# Patient Record
Sex: Female | Born: 1970
Health system: Southern US, Community
[De-identification: ages and names within clinical notes are randomized; demographics above are authoritative.]

## PROBLEM LIST (undated history)

## (undated) DIAGNOSIS — IMO0002 Reserved for concepts with insufficient information to code with codable children: Secondary | ICD-10-CM

## (undated) DIAGNOSIS — I7381 Erythromelalgia: Secondary | ICD-10-CM

## (undated) DIAGNOSIS — J302 Other seasonal allergic rhinitis: Secondary | ICD-10-CM

## (undated) DIAGNOSIS — Z9889 Other specified postprocedural states: Secondary | ICD-10-CM

## (undated) HISTORY — DX: Other seasonal allergic rhinitis: J30.2

## (undated) HISTORY — DX: Erythromelalgia: I73.81

## (undated) HISTORY — DX: Reserved for concepts with insufficient information to code with codable children: IMO0002

## (undated) HISTORY — PX: OTHER SURGICAL HISTORY: SHX169

## (undated) HISTORY — DX: Other specified postprocedural states: Z98.890

## (undated) HISTORY — PX: WISDOM TOOTH EXTRACTION: SHX21

---
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
## 2003-03-29 ENCOUNTER — Other Ambulatory Visit: Admission: RE | Admit: 2003-03-29 | Discharge: 2003-03-29 | Payer: Self-pay | Admitting: Obstetrics and Gynecology

## 2003-04-26 ENCOUNTER — Encounter: Admission: RE | Admit: 2003-04-26 | Discharge: 2003-04-26 | Payer: Self-pay | Admitting: Family Medicine

## 2004-05-21 ENCOUNTER — Other Ambulatory Visit: Admission: RE | Admit: 2004-05-21 | Discharge: 2004-05-21 | Payer: Self-pay | Admitting: Obstetrics and Gynecology

## 2004-11-28 ENCOUNTER — Inpatient Hospital Stay (HOSPITAL_COMMUNITY): Admission: RE | Admit: 2004-11-28 | Discharge: 2004-12-01 | Payer: Self-pay | Admitting: Obstetrics and Gynecology

## 2005-01-09 ENCOUNTER — Other Ambulatory Visit: Admission: RE | Admit: 2005-01-09 | Discharge: 2005-01-09 | Payer: Self-pay | Admitting: Obstetrics and Gynecology

## 2006-01-12 ENCOUNTER — Other Ambulatory Visit: Admission: RE | Admit: 2006-01-12 | Discharge: 2006-01-12 | Payer: Self-pay | Admitting: Obstetrics and Gynecology

## 2008-04-24 LAB — CONVERTED CEMR LAB: Pap Smear: NORMAL

## 2008-11-05 ENCOUNTER — Emergency Department (HOSPITAL_BASED_OUTPATIENT_CLINIC_OR_DEPARTMENT_OTHER): Admission: EM | Admit: 2008-11-05 | Discharge: 2008-11-05 | Payer: Self-pay | Admitting: Emergency Medicine

## 2008-11-13 ENCOUNTER — Ambulatory Visit: Payer: Self-pay | Admitting: Internal Medicine

## 2008-11-13 DIAGNOSIS — I7381 Erythromelalgia: Secondary | ICD-10-CM | POA: Insufficient documentation

## 2009-03-09 ENCOUNTER — Ambulatory Visit: Payer: Self-pay | Admitting: Internal Medicine

## 2009-03-30 ENCOUNTER — Telehealth: Payer: Self-pay | Admitting: Internal Medicine

## 2009-04-03 DIAGNOSIS — I7381 Erythromelalgia: Secondary | ICD-10-CM

## 2009-04-03 HISTORY — DX: Erythromelalgia: I73.81

## 2009-04-06 ENCOUNTER — Encounter: Payer: Self-pay | Admitting: Internal Medicine

## 2009-04-18 ENCOUNTER — Telehealth: Payer: Self-pay | Admitting: Internal Medicine

## 2009-04-20 ENCOUNTER — Ambulatory Visit: Payer: Self-pay | Admitting: Internal Medicine

## 2009-10-08 ENCOUNTER — Ambulatory Visit: Payer: Self-pay | Admitting: Internal Medicine

## 2009-12-26 ENCOUNTER — Telehealth: Payer: Self-pay | Admitting: Internal Medicine

## 2010-03-24 ENCOUNTER — Encounter: Payer: Self-pay | Admitting: Obstetrics and Gynecology

## 2010-04-02 NOTE — Consult Note (Signed)
Summary: [facility] Rheumatology & Clinical Immunology  [facility] Rheumatology & Clinical Immunology   Imported By: Lanelle Bal 04/26/2009 10:26:10  _____________________________________________________________________  External Attachment:    Type:   Image     Comment:   External Document

## 2010-04-02 NOTE — Assessment & Plan Note (Signed)
Summary: 1 month follow up/mhf   Vital Signs:  Patient profile:   40 year old female Weight:      108 pounds BMI:     20.48 O2 Sat:      100 % on Room air Temp:     98.1 degrees F oral Pulse rate:   64 / minute Pulse rhythm:   regular Resp:     16 per minute BP sitting:   110 / 60  (right arm) Cuff size:   regular  Vitals Entered By: Glendell Docker CMA (April 20, 2009 9:58 AM)  O2 Flow:  Room air  Primary Care Provider:  [name] DO  CC:  1 Month Follow up.  History of Present Illness: 1 Month Follow up   40 y/o white female for f/u she was seen by Dr Christen Bame (rheum) at [facility] he feels her symptoms more consistent with erythromelalgia. she tried aspirin but with no significant improvement   Allergies: 1)  ! Pcn  Past History:  Past Medical History: Chronic red painful toes - question raynaud's vs erythomelagia     Past Surgical History: Caesarean section 2003 & 2006     Family History: Family History of Alcoholism/Addiction Father Family History of Arthritis Maternal grandmother Family History Breast cancer maternal great aunt Family History High cholesterol maternal grandparents Family History High cholesterol- maternal grandparents Family History Hypertension- Mother/maternal grandmother Family History Kidney disease maternal grandfather Family History Depression-maternal grandmother      Social History: Occupation: Scientific laboratory technician (homemaker) Married- 13 years 1 son 7   1 daughter 4    Physical Exam  General:  alert, well-developed, and well-nourished.   Lungs:  normal respiratory effort and normal breath sounds.   Heart:  normal rate, regular rhythm, and no gallop.   Skin:  tips of toes - left foot 2-4 red and swollen   Impression & Recommendations:  Problem # 1:  ERYTHROMELALGIA (ZOX-096.04) Pt seen by Dr. Christen Bame at [facility].  He feels her symptoms more consisent with erthromelalgia.  No improvement with aspirin therapy.   We discussed  stopping BCP to see if it has any effect.  Complete Medication List: 1)  Enpresse-28 Tabs (Levonorg-eth estrad triphasic) .... Take 1 tablet by mouth once a day 2)  All Day Calcium 600-500 Mg-unit Xr24h-tab (Calcium carb-cholecalciferol) .... Take 1 tablet by mouth once a day

## 2010-04-02 NOTE — Assessment & Plan Note (Signed)
Summary: COUGH/HEA   Vital Signs:  Patient profile:   40 year old female Height:      61 inches Weight:      105.25 pounds BMI:     19.96 O2 Sat:      98 % on Room air Temp:     98.6 degrees F oral Pulse rate:   69 / minute Pulse rhythm:   regular Resp:     18 per minute BP sitting:   102 / 68  (right arm) Cuff size:   regular  Vitals Entered By: Glendell Docker CMA (October 08, 2009 1:10 PM)  O2 Flow:  Room air CC: Rm 3- Cough Is Patient Diabetic? No Pain Assessment Patient in pain? no       Does patient need assistance? Functional Status Self care Ambulation Normal   Primary Care Provider:  DThomos Lemons DO  CC:  Rm 3- Cough.  History of Present Illness: 40 y/o white female c/o productive cough , interferring with sleep, chest discomfort from cough, denies fever, taken delsym with little relief  c/o fatigue some nasal congestion  daughter was sick 1 week ago with febrile illness but is feeling better  Preventive Screening-Counseling & Management  Alcohol-Tobacco     Smoking Status: never  Allergies: 1)  ! Pcn  Past History:  Past Medical History: Chronic red painful toes - erythomelagia    (Evaluated by rheum - Dr Christen Bame at [facility] 04/2009)  Past Surgical History: Caesarean section 2003 & 2006      Family History: Family History of Alcoholism/Addiction Father Family History of Arthritis Maternal grandmother Family History Breast cancer maternal great aunt Family History High cholesterol maternal grandparents Family History High cholesterol- maternal grandparents Family History Hypertension- Mother/maternal grandmother Family History Kidney disease maternal grandfather Family History Depression-maternal grandmother       Physical Exam  General:  alert, well-developed, and well-nourished.   Ears:  R ear normal and L ear normal.   Mouth:  pharyngeal erythema.   Neck:  supple, no masses, and no neck tenderness.   Lungs:  normal respiratory  effort and normal breath sounds.   Heart:  normal rate, regular rhythm, and no gallop.     Impression & Recommendations:  Problem # 1:  BRONCHITIS-ACUTE (ICD-466.0)  Her updated medication list for this problem includes:    Azithromycin 250 Mg Tabs (Azithromycin) .Marland Kitchen... 2 tabs on day one, then one by mouth once daily x 4 days    Hydrocodone-homatropine 5-1.5 Mg/1ml Syrp (Hydrocodone-homatropine) .Marland KitchenMarland KitchenMarland KitchenMarland Kitchen 5 ml by mouth at bedtime as needed for cough  Take antibiotics and other medications as directed. Encouraged to push clear liquids, get enough rest, and take acetaminophen as needed. To be seen in 5-7 days if no improvement, sooner if worse.  Complete Medication List: 1)  Enpresse-28 Tabs (Levonorg-eth estrad triphasic) .... Take 1 tablet by mouth once a day 2)  All Day Calcium 600-500 Mg-unit Xr24h-tab (Calcium carb-cholecalciferol) .... Take 1 tablet by mouth once a day 3)  Azithromycin 250 Mg Tabs (Azithromycin) .... 2 tabs on day one, then one by mouth once daily x 4 days 4)  Hydrocodone-homatropine 5-1.5 Mg/48ml Syrp (Hydrocodone-homatropine) .... 5 ml by mouth at bedtime as needed for cough  Patient Instructions: 1)  Call our office if your symptoms do not  improve or gets worse. Prescriptions: HYDROCODONE-HOMATROPINE 5-1.5 MG/5ML SYRP (HYDROCODONE-HOMATROPINE) 5 ml by mouth at bedtime as needed for cough  #60 ml x 0   Entered and Authorized by:  Dondra Spry DO   Signed by:   [name] DO on 10/08/2009   Method used:   Print then Give to Patient   RxID:   [id number] AZITHROMYCIN 250 MG TABS (AZITHROMYCIN) 2 tabs on day one, then one by mouth once daily x 4 days  #6 x 0   Entered and Authorized by:   [name] DO   Signed by:   [name] DO on 10/08/2009   Method used:   Electronically to        [facility] (retail)       [address]       [place], [place]  [address]       Ph: [phone]       Fax: [phone]   RxID:    [id number]   Current Allergies (reviewed today): ! PCN

## 2010-04-02 NOTE — Assessment & Plan Note (Signed)
Summary: Feet red and swollen/hea   Vital Signs:  Patient profile:   40 year old female Height:      61 inches Weight:      107.50 pounds BMI:     20.39 O2 Sat:      99 % on Room air Temp:     98.4 degrees F oral Pulse rate:   54 / minute Pulse rhythm:   regular Resp:     16 per minute BP sitting:   100 / 70  (left arm) Cuff size:   regular  Vitals Entered By: Glendell Docker CMA (March 09, 2009 11:01 AM)  O2 Flow:  Room air  Primary Care Provider:  [name] DO  CC:  Bilateral Feet pain.  History of Present Illness: 40 y/o white female with possible hx of Raynauds c/o red toes  - L > R x one week.  her symptoms always worse in Winter.  she has never had hand symptoms. she was prev seen by dermatologist who tried topical cream w/o success.    Allergies: 1)  ! 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   Lungs:  normal respiratory effort and normal breath sounds.   Heart:  normal rate, regular rhythm, and no gallop.   Pulses:  radial,dorsalis pedis and posterior tibial pulses are full and equal bilaterally Skin:  tips of toes - left foot 2-4 red and swollen   Impression & Recommendations:  Problem # 1:  RAYNAUD'S SYNDROME (ICD-443.0)  Pt usually has flare during winter  months.  toes are usually affected.  she saw rheumaotoligst 7-8 years ago.  she reports w/u for secondary raynauds negative.  her current symptoms limited to left toes.    refer to rheumatologist at [facility] for repeat work up.  consider anti phospholipid syndrome  trial of low dose nifedipine.  pt will ask pharm if she can take split nifedipine tab. take EC aspirin 81 mg once daily  Orders: Rheumatology Referral (Rheumatology)  Complete Medication List: 1)  Enpresse-28 Tabs (Levonorg-eth estrad triphasic) .... Take 1 tablet by mouth once a day 2)  Multivitamins Tabs (Multiple vitamin) .... Take 1 tablet by mouth once a day 3)  All Day Calcium 600-500 Mg-unit Xr24h-tab (Calcium carb-cholecalciferol) .... Take 1 tablet by mouth once a day 4)  Nifedipine 30 Mg Xr24h-tab (Nifedipine) .Marland Kitchen.. 1 by mouth once daily  Other Orders: Influenza Vaccine NON MCR ([address]109) Admin 1st Vaccine (60454)  Patient Instructions: 1)  Please schedule a follow-up appointment in 1 month. Prescriptions: NIFEDIPINE 30 MG XR24H-TAB (NIFEDIPINE) 1 by mouth once daily  #30 x 1   Entered and Authorized by:   [name] DO   Signed by:   [name] DO on 03/09/2009   Method used:   Electronically to        [facility] (retail)       [address]       [place], [place]  [address]       Ph: [phone]       Fax: [phone]   RxID:   [id number]    Immunizations Administered:  Influenza Vaccine # 1:    Vaccine Type: Fluvax Non-MCR    Site: left deltoid    Mfr: GlaxoSmithKline    Dose: 0.5 ml    Route: IM    Given by: Glendell Docker CMA    Exp. Date: 08/30/2009    Lot #: [id number]    VIS given: 10/10/2008  Flu Vaccine Consent Questions:    Do you have a history of severe allergic reactions to this vaccine? no    Any prior history of allergic reactions to egg and/or gelatin? no    Do you have a sensitivity to the preservative Thimersol? no    Do you have a past history of  Guillan-Barre Syndrome? no    Do you currently have an acute febrile illness? no    Have you ever had a severe reaction to latex? no    Vaccine information given and explained to patient? yes    Are you currently pregnant? no   Current Allergies (reviewed today): ! PCN

## 2010-04-02 NOTE — Assessment & Plan Note (Signed)
Summary: new to [place] per dr Kyrel Leighton/mhf remove staples   Vital Signs:  Patient profile:   40 year old female Height:      611 inches Weight:      106.50 pounds BMI:     0.20 Temp:     99.1 degrees F oral Pulse rate:   60 / minute Pulse rhythm:   regular Resp:     18 per minute BP sitting:   110 / 80  (left arm) Cuff size:   regular  Vitals Entered By: Glendell Docker CMA (November 13, 2008 4:10 PM)  Primary Care Provider:  Dondra Spry DO  CC:  New patient.  History of Present Illness: New Patient  40 y/o white female to establish.  She suffered scalp laceration on 11/05/2008.  She accidentally hit her head with minivan door.  There was profuse bleeding.  She was seen at ER and wound was closed with 3 staples.  She notes mild headahes which are getting better.  No redness or drainage from scalp.  She kept area clean and applied abx ointment daily.  She has otherwise be healthy. Two C sections.     After first child, she noticed redness and pain or bilateral toes when exposed to cold weather.  She was seen by rheumatologist - w/u was negative.  Possible primary Raynaud's vs dermatologic etiology.  Derm recommended trial of rogaine to toes.  Preventive Screening-Counseling & Management  Alcohol-Tobacco     Alcohol drinks/day: 1     Alcohol type: wine     Alcohol Counseling: to decrease amount and/or frequency of alcohol intake     Smoking Status: never     Tobacco Counseling: not indicated; no tobacco use  Caffeine-Diet-Exercise     Caffeine use/day: 1-2 cups coffe daily     Caffeine Counseling: not indicated; caffeine use is not excessive or problematic     Does Patient Exercise: yes     Times/week: 4  Allergies (verified): 1)  ! 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  The patient denies fever, chest pain, dyspnea on exertion, prolonged cough, abdominal pain, melena, hematochezia, and severe indigestion/heartburn.         All other systems were reviewed and were negative. No hx of oral ulcers.  No arthritis.  No rashes.    Physical Exam  General:  alert, well-developed, and well-nourished.   Head:  healed 3-4 cm laceration 1 inch above hair line. Eyes:  vision grossly intact, pupils equal, pupils round, and pupils reactive to light.   Ears:  R ear normal and L ear normal.   Mouth:  Oral mucosa and oropharynx without lesions or exudates.  Teeth in good repair. Neck:  No deformities, masses, or tenderness noted. Lungs:  normal respiratory effort, normal breath sounds, and no wheezes.   Heart:  normal  rate, regular rhythm, no murmur, and no gallop.   Abdomen:  soft, non-tender, normal bowel sounds, no hepatomegaly, and no splenomegaly.   Pulses:  dorsalis pedis and posterior tibial pulses are full and equal bilaterally Extremities:  No lower extremity edema  Neurologic:  cranial nerves II-XII intact and gait normal.   Psych:  normally interactive, good eye contact, not anxious appearing, and not depressed appearing.     Impression & Recommendations:  Problem # 1:  ENCOUNTER FOR REMOVAL OF SUTURES (ICD-V58.32) Area prepped with betadyne.  3 staples removed from scalp.  No  complication.  No wound dehiecense.  Discussed aftercare.  Patient advised to call office if symptoms persist or worsen.  Problem # 2:  RAYNAUD'S SYNDROME (ICD-443.0) Possible hx of raynaud's of bilateral toes.  Obtain records from rheumatologist and dermatologist.  Complete Medication List: 1)  Enpresse-28 Tabs (Levonorg-eth estrad triphasic) .... Take 1 tablet by mouth once a day 2)  Multivitamins Tabs (Multiple vitamin) .... Take 1 tablet by mouth once a day  Current Allergies (reviewed today): ! PCN   Preventive Care Screening  Last Tetanus Booster:    Date:  11/05/2008    Results:  Tdap   Pap Smear:    Date:  04/24/2008    Results:  normal

## 2010-04-02 NOTE — Progress Notes (Signed)
Summary: Alllergy Headaches  Phone Note Call from Patient Call back at [phone]   Caller: Patient Call For: [name] DO Summary of Call: patient called and left voice message stating she is having problems with seasonal allergies. She has had an increase in headches and pressure above her eye. She would like to know if there is something that she could take over the counter to get relief. Initial call taken by: Glendell Docker CMA,  December 26, 2009 9:23 AM  Follow-up for Phone Call        she can try using allergra 180 mg once daily use neil med sinus rinse  2-3 x per week also use ibuprofen 400 mg two times a day as needed (up to 1 week)  Follow-up by: [name] DO,  December 26, 2009 11:51 AM  Additional Follow-up for Phone Call Additional follow up Details #1::        call returned to patient at [phone], she was advised per Dr Artist Pais instructions Additional Follow-up by: Glendell Docker CMA,  December 27, 2009 8:02 AM

## 2010-04-02 NOTE — Letter (Signed)
Summary: [facility] Rheumatology & Clinical Immunology  [facility] Rheumatology & Clinical Immunology   Imported By: Lanelle Bal 05/10/2009 08:13:48  _____________________________________________________________________  External Attachment:    Type:   Image     Comment:   External Document

## 2010-04-02 NOTE — Progress Notes (Signed)
Summary: Medication Adjustment  Phone Note Call from Patient   Caller: Patient Details for Reason: Medication   Summary of Call: Pt called  given rx  for Raynard( Nifedipine),medication has helped feet , but  causing  Headaches and fatique , would like to stop medication. She has appt next Friday to see Rheumatologist at [facility] . should she cont. medication until after this appt  please advise   phone # [phone] Initial call taken by: Darral Dash,  March 30, 2009 3:23 PM  Follow-up for Phone Call        I suggest she try taking lower dose.  see rx Follow-up by: [name] DO,  March 30, 2009 5:03 PM  Additional Follow-up for Phone Call Additional follow up Details #1::        patient advised per Dr Artist Pais instructions Additional Follow-up by: Glendell Docker CMA,  March 30, 2009 5:19 PM    New/Updated Medications: NIFEDIPINE 10 MG CAPS (NIFEDIPINE) one by mouth two times a day Prescriptions: NIFEDIPINE 10 MG CAPS (NIFEDIPINE) one by mouth two times a day  #60 x 0   Entered and Authorized by:   [name] DO   Signed by:   [name] DO on 03/30/2009   Method used:   Electronically to        [facility] (retail)       [address]       [place], [place]  [address]       Ph: [phone]       Fax: [phone]   RxID:   [id number]

## 2010-04-02 NOTE — Progress Notes (Signed)
Summary: want to make sure you recieved Dr.Valen's notes  Phone Note Call from Patient   Summary of Call: She wanted to make sure Dr.Chanele Douglas got  the ov notes from Dr.Balen (Specialist) that Dr.Nasiah Polinsky had sent her to.   She states that Dr.Mansel Strother needs her ov notes from the specialist by the time of her next f/u appt. with Dr.Chris Cripps Initial call taken by: Michaelle Copas,  April 18, 2009 12:11 PM  Follow-up for Phone Call        Office notes requested via fax from  [facility] hospital Follow-up by: Glendell Docker CMA,  April 18, 2009 4:20 PM

## 2010-05-06 ENCOUNTER — Other Ambulatory Visit: Payer: Self-pay | Admitting: Obstetrics and Gynecology

## 2010-05-06 DIAGNOSIS — Z1231 Encounter for screening mammogram for malignant neoplasm of breast: Secondary | ICD-10-CM

## 2010-05-17 ENCOUNTER — Other Ambulatory Visit (HOSPITAL_BASED_OUTPATIENT_CLINIC_OR_DEPARTMENT_OTHER): Payer: Self-pay | Admitting: Obstetrics and Gynecology

## 2010-05-17 DIAGNOSIS — Z1239 Encounter for other screening for malignant neoplasm of breast: Secondary | ICD-10-CM

## 2010-07-05 ENCOUNTER — Ambulatory Visit (HOSPITAL_BASED_OUTPATIENT_CLINIC_OR_DEPARTMENT_OTHER)
Admission: RE | Admit: 2010-07-05 | Discharge: 2010-07-05 | Disposition: A | Discharge status: Home / Self Care | Payer: BC Managed Care – PPO | Source: Ambulatory Visit | Attending: Obstetrics and Gynecology | Admitting: Obstetrics and Gynecology

## 2010-07-05 DIAGNOSIS — Z1239 Encounter for other screening for malignant neoplasm of breast: Secondary | ICD-10-CM

## 2010-07-05 DIAGNOSIS — Z1231 Encounter for screening mammogram for malignant neoplasm of breast: Secondary | ICD-10-CM

## 2010-07-09 ENCOUNTER — Ambulatory Visit: Payer: Self-pay

## 2010-07-19 NOTE — H&P (Signed)
Melinda Andersen, Melinda Andersen                 ACCOUNT NO.:  [id number]   MEDICAL RECORD NO.:  [id number]          PATIENT TYPE:  INP   LOCATION:  NA                            [facility]ILITY[facility]  [facility]   PHYSICIAN:  Janine Limbo, M.D.DATE OF BIRTH:  [birth year]   DATE OF ADMISSION:  DATE OF DISCHARGE:                                HISTORY & PHYSICAL   DATE OF SURGERY:  November 28, 2004.   HISTORY OF PRESENT ILLNESS:  Melinda Andersen is a 40 year old female, gravida 2,  para 1-0-0-1, who presents at 40 weeks' gestation (EDC is December 12, 2004).  The patient has been followed at the [facility] and  [facility].  The patient has had a  prior cesarean section and she desires a repeat cesarean section.  Her  pregnancy has been largely uncomplicated.   OBSTETRICAL HISTORY:  In August 2003, the patient delivered a 6 pound 8  ounce female infant by cesarean delivery.   DRUG ALLERGIES:  PENICILLIN causes a rash.   PAST MEDICAL HISTORY:  1.  The patient denies hypertension and diabetes.  2.  She had her wisdom teeth removed at the age of 73.   SOCIAL HISTORY:  The patient denies cigarette use, alcohol use, and  recreational drug use.   REVIEW OF SYSTEMS:  Normal pregnancy complaints.   FAMILY HISTORY:  Noncontributory.   PHYSICAL EXAMINATION:  VITAL SIGNS:  Weight is 132 pounds, height is 5 feet  1 inch.  HEENT:  Within normal limits.  CHEST:  Clear.  HEART:  Regular rate and rhythm.  BREASTS:  Without masses.  ABDOMEN:  Gravid with a fundal height of 33-to-34-cm.  EXTREMITIES:  Within normal limits.  NEUROLOGIC:  Grossly normal.  PELVIC:  Cervix was closed when last checked.   LABORATORY VALUES:  Blood type is A positive.  Antibody screen negative.  VDRL nonreactive.  Rubella immune.  HVSAG negative.  Pap is within normal  limits.  Third trimester beta strep is negative.  Glucola screen within  normal limits.   ASSESSMENT:  1.  Term  intrauterine pregnancy.  2.  Prior cesarean section.  3.  Desires repeat cesarean section.   PLAN:  The patient will undergo a repeat low-transverse cesarean section.  She understands the indications for her surgical procedure, and she accepts  the risk of, but not limited to, anesthetic complications, bleeding,  infections, and possible damage to the surrounding organs.      Janine Limbo, M.D.  Electronically Signed     AVS/MEDQ  D:  11/23/2004  T:  11/23/2004  Job:  [id number]

## 2010-07-19 NOTE — Discharge Summary (Signed)
Melinda Andersen, Melinda Andersen                 ACCOUNT NO.:  [id number]   MEDICAL RECORD NO.:  [id number]          PATIENT TYPE:  INP   LOCATION:  9138                          [facility]ILITY:  [facility]   PHYSICIAN:  Janine Limbo, M.D.DATE OF BIRTH:  1970-12-19   DATE OF ADMISSION:  11/28/2004  DATE OF DISCHARGE:  12/01/2004                                 DISCHARGE SUMMARY   ADMISSION DIAGNOSES:  1.  Intrauterine pregnancy at term.  2.  Previous cesarean section.  3.  Desires repeat cesarean section.   DISCHARGE DIAGNOSES:  1.  Intrauterine pregnancy at term.  2.  Previous cesarean section.  3.  Desires repeat cesarean section.  4.  Status post cesarean section delivery of a viable female infant named      Melinda Andersen weighing 5 pounds 10 ounces, Apgars 9 and 9.   OPERATION/PROCEDURE:  1.  Spinal anesthesia.  2.  Repeat low transverse cesarean section.   HOSPITAL COURSE:  The patient was admitted for an elective repeat cesarean  section performed under spinal anesthesia by Dr. Stefano Gaul with no  complications.  Estimated blood loss of 800 mL.  She was delivered of a  female infant weighing 5 pounds 10 ounces, Apgars 9 and 9.  Taken to  recovery and then to mother/baby unit where she did well breast feeding.  On  day #1, hemoglobin was 11.2.  Temperature was 100.2, but remained afebrile  after that.  She was given routine postoperative care.  On postoperative day  #2, she continued to improve.  Was tolerating foods and voiding without  problems.  On postoperative day #3, she continued to improve and was ready  to go home.  Vital signs were stable.  Chest was clear.  Heart was regular  rate and rhythm.  Abdomen was soft and appropriately tender.  Incision was  clean, dry and intact with subcutaneous sutures.  Lochia was small.  Extremities within normal limits.  She was deemed to have received the full  benefit of her hospitalization and was discharged home.   DISCHARGE MEDICATIONS:  1.  Motrin  600 mg p.o. q.6h. p.r.n.  2.  Tylox one to two p.o. q.4h. p.r.n.   DISCHARGE LABORATORY DATA:  White blood cell count 10.5, hemoglobin 11.2,  platelet count 193, RPR nonreactive.   DISCHARGE INSTRUCTIONS:  Per [facility] handout.   FOLLOW UP:  In six weeks or p.r.n.      Marie L. Williams, C.N.M.      Janine Limbo, M.D.  Electronically Signed    MLW/MEDQ  D:  12/01/2004  T:  12/01/2004  Job:  [id number]

## 2010-07-19 NOTE — Op Note (Signed)
Melinda Andersen, Melinda Andersen                 ACCOUNT NO.:  [id number]   MEDICAL RECORD NO.:  [id number]          PATIENT TYPE:  INP   LOCATION:  9199                          FACILITY:  [facility]   PHYSICIAN:  Janine Limbo, M.D.DATE OF BIRTH:  1970-12-22   DATE OF PROCEDURE:  11/28/2004  DATE OF DISCHARGE:                                 OPERATIVE REPORT   PREOPERATIVE DIAGNOSIS:  1.  Term intrauterine gestation.  2.  Prior cesarean section.  3.  Desires repeat cesarean section.   POSTOPERATIVE DIAGNOSIS:  1.  Term intrauterine gestation.  2.  Prior cesarean section.  3.  Desires repeat cesarean section.   PROCEDURE:  Repeat low transverse cesarean section.   SURGEON:  Dr. Leonard Schwartz   FIRST ASSISTANT:  Elby Showers. Williams, C.N.M.   ANESTHETIC:  Spinal.   DISPOSITION:  Melinda Andersen is a 40 year old female, gravida 2, para 1-0-0-1,  who presents at [redacted] weeks gestation for a repeat cesarean section. She has  been followed at the [facility] division of [facility]. The patient understands the indications for her surgical  procedure and she accepts the risk of, but not limited to, anesthetic  complications, bleeding, infection, and possible damage to the surrounding  organs.   FINDINGS:  A 5 pounds 10 ounces female infant Orvan Falconer) was delivered from  cephalic presentation. The Apgars were 9 at one minute and 9 at 5 minutes.  The uterus, fallopian tubes, and the ovaries were normal for the gravid  state.   PROCEDURE:  The patient was taken to the operating room where a spinal  anesthetic was given. The patient's abdomen and perineum were prepped with  multiple layers of Betadine. A Foley catheter was placed in the bladder. The  patient was then sterilely draped. The lower abdomen was injected with 10 mL  of half percent Marcaine with epinephrine. A low transverse incision was  made and carried sharply through the subcutaneous tissue, fascia,  and the  anterior peritoneum. An incision was made in the lower uterine segment and  extended transversely. The fetal head was delivered without difficulty. The  mouth and nose were suctioned. Remainder of the infant was delivered. The  cord was clamped and cut and the infant was handed to the awaiting pediatric  team. Routine cord blood studies were obtained. The placenta was removed.  The uterine cavity was cleaned of amniotic fluid, clotted blood, and  membranes. The uterine incision was closed using a running locking suture of  2-0 Vicryl followed by imbricating suture of 2-0 Vicryl. Hemostasis was  adequate. The patient was noted to drain clear yellow urine. The pelvis was  then vigorously irrigated. Again hemostasis was adequate. The anterior  peritoneum and abdominal musculature were reapproximated in the midline  using 2-0 Vicryl. The fascia was closed using a running suture of 0 Vicryl  followed by three interrupted sutures of 0 Vicryl. The subcutaneous layer  was closed using 0 Vicryl. Skin was reapproximated using a 3-0 Monocryl. The  prior scar was removed prior to closure. Sponge, needle, instrument  counts  were correct on two occasions. Estimated blood loss for the procedure was  800 cc. The patient tolerated her procedure well. The patient was taken to  the recovery room in stable condition. The infant was taken to the full-term  nursery in stable condition.      Janine Limbo, M.D.  Electronically Signed     AVS/MEDQ  D:  11/28/2004  T:  11/28/2004  Job:  [id number]

## 2011-03-13 ENCOUNTER — Ambulatory Visit: Payer: BC Managed Care – PPO

## 2011-03-14 ENCOUNTER — Ambulatory Visit (INDEPENDENT_AMBULATORY_CARE_PROVIDER_SITE_OTHER): Payer: BC Managed Care – PPO | Admitting: Internal Medicine

## 2011-03-14 DIAGNOSIS — Z23 Encounter for immunization: Secondary | ICD-10-CM

## 2011-03-15 NOTE — Progress Notes (Signed)
Patient ID: Melinda Andersen, female   DOB: 1970-05-30, 41 y.o.   MRN: [id number]

## 2011-04-25 ENCOUNTER — Ambulatory Visit (INDEPENDENT_AMBULATORY_CARE_PROVIDER_SITE_OTHER): Payer: BC Managed Care – PPO | Admitting: Family

## 2011-04-25 ENCOUNTER — Encounter: Payer: Self-pay | Admitting: Family

## 2011-04-25 VITALS — BP 110/72 | HR 68 | Temp 97.8°F | Resp 16 | Wt 110.0 lb

## 2011-04-25 DIAGNOSIS — J329 Chronic sinusitis, unspecified: Secondary | ICD-10-CM

## 2011-04-25 MED ORDER — LEVOFLOXACIN 500 MG PO TABS: 500.0000 mg | ORAL_TABLET | Freq: Every day | ORAL | Status: AC

## 2011-04-25 NOTE — Assessment & Plan Note (Signed)
Will plan to treat with Levaquin x 7 days.  Recommended motrin PRN HA and sudafed as needed during the day for congestion.  Recommended [place] of condoms as back up birth control due to possible drug interaction with abx.

## 2011-04-25 NOTE — Patient Instructions (Signed)

## 2011-04-25 NOTE — Progress Notes (Signed)
  Subjective:    Patient ID: Melinda Andersen, female    DOB: Jun 04, 1970, 41 y.o.   MRN: [id number]  HPI  Melinda Andersen is a 41 yr old female who presents today with chief complaint of frontal headache.  Notes that it is painful to touch above her eyes.   Notes chronic nasal drainage, now more yellow in color.  HA is associated with poor energy.  Denies associated ear pain, cough or known fever. .  Denies previous hx of sinus infection.  She has tried tylenol at home and ibuprofen without relief.     Review of Systems See HPI  Past Medical History  Diagnosis Date  . Erythromelalgia 04/2009    chronic red painful toes--Dr Christen Bame at [facility] (rheumatologist)    History   Social History  . Marital Status: Married    Spouse Name: N/A    Number of Children: 2  . Years of Education: N/A   Occupational History  . Not on file.   Social History Main Topics  . Smoking status: Never Smoker   . Smokeless tobacco: Never Used  . Alcohol Use: Not on file  . Drug Use: Not on file  . Sexually Active: Not on file   Other Topics Concern  . Not on file   Social History Narrative  . No narrative on file    Past Surgical History  Procedure Date  . Cesarean section 2003 & 2006    Family History  Problem Relation Age of Onset  . Alcohol abuse Father   . Arthritis Maternal Grandmother   . Hyperlipidemia Maternal Grandmother   . Hypertension Maternal Grandmother   . Depression Maternal Grandmother   . Hyperlipidemia Maternal Grandfather   . Kidney disease Maternal Grandfather   . Cancer Other     breast    Allergies  Allergen Reactions  . Penicillins     REACTION: Rash    No current outpatient prescriptions on file prior to visit.    BP 110/72  Pulse 68  Temp(Src) 97.8 F (36.6 C) (Oral)  Resp 16  Wt 110 lb (49.896 kg)  SpO2 95%       Objective:   Physical Exam  Constitutional: She appears well-developed and well-nourished. No distress.  HENT:  Head: Normocephalic and  atraumatic.  Right Ear: Tympanic membrane and ear canal normal.  Left Ear: Tympanic membrane and ear canal normal.  Mouth/Throat: No oropharyngeal exudate.  Eyes: Pupils are equal, round, and reactive to light.  Neck: Neck supple.  Cardiovascular: Normal rate and regular rhythm.   No murmur heard. Pulmonary/Chest: Effort normal and breath sounds normal. No respiratory distress. She has no wheezes. She has no rales. She exhibits no tenderness.  Musculoskeletal: She exhibits no edema.  Lymphadenopathy:    She has no cervical adenopathy.  Skin: Skin is warm and dry.  Psychiatric: She has a normal mood and affect. Her behavior is normal. Judgment and thought content normal.          Assessment & Plan:

## 2011-05-07 ENCOUNTER — Ambulatory Visit (INDEPENDENT_AMBULATORY_CARE_PROVIDER_SITE_OTHER): Payer: BC Managed Care – PPO | Admitting: Obstetrics and Gynecology

## 2011-05-07 DIAGNOSIS — R87619 Unspecified abnormal cytological findings in specimens from cervix uteri: Secondary | ICD-10-CM

## 2011-05-07 DIAGNOSIS — Z01419 Encounter for gynecological examination (general) (routine) without abnormal findings: Secondary | ICD-10-CM

## 2011-05-07 DIAGNOSIS — IMO0002 Reserved for concepts with insufficient information to code with codable children: Secondary | ICD-10-CM

## 2011-05-07 HISTORY — DX: Unspecified abnormal cytological findings in specimens from cervix uteri: R87.619

## 2011-05-07 HISTORY — DX: Reserved for concepts with insufficient information to code with codable children: IMO0002

## 2011-06-23 ENCOUNTER — Other Ambulatory Visit: Payer: Self-pay | Admitting: Obstetrics and Gynecology

## 2011-06-23 DIAGNOSIS — Z1231 Encounter for screening mammogram for malignant neoplasm of breast: Secondary | ICD-10-CM

## 2011-07-09 ENCOUNTER — Ambulatory Visit (HOSPITAL_BASED_OUTPATIENT_CLINIC_OR_DEPARTMENT_OTHER)
Admission: RE | Admit: 2011-07-09 | Discharge: 2011-07-09 | Disposition: A | Discharge status: Home / Self Care | Payer: BC Managed Care – PPO | Source: Ambulatory Visit | Attending: Obstetrics and Gynecology | Admitting: Obstetrics and Gynecology

## 2011-07-09 DIAGNOSIS — Z1231 Encounter for screening mammogram for malignant neoplasm of breast: Secondary | ICD-10-CM | POA: Insufficient documentation

## 2011-08-05 ENCOUNTER — Encounter: Payer: Self-pay | Admitting: Obstetrics and Gynecology

## 2011-11-25 ENCOUNTER — Ambulatory Visit (INDEPENDENT_AMBULATORY_CARE_PROVIDER_SITE_OTHER): Payer: BC Managed Care – PPO | Admitting: Obstetrics and Gynecology

## 2011-11-25 ENCOUNTER — Encounter: Payer: Self-pay | Admitting: Obstetrics and Gynecology

## 2011-11-25 VITALS — BP 102/62 | Wt 111.0 lb

## 2011-11-25 DIAGNOSIS — IMO0001 Reserved for inherently not codable concepts without codable children: Secondary | ICD-10-CM

## 2011-11-25 DIAGNOSIS — IMO0002 Reserved for concepts with insufficient information to code with codable children: Secondary | ICD-10-CM

## 2011-11-25 DIAGNOSIS — R922 Inconclusive mammogram: Secondary | ICD-10-CM

## 2011-11-25 DIAGNOSIS — R87610 Atypical squamous cells of undetermined significance on cytologic smear of cervix (ASC-US): Secondary | ICD-10-CM

## 2011-11-25 DIAGNOSIS — B977 Papillomavirus as the cause of diseases classified elsewhere: Secondary | ICD-10-CM

## 2011-11-25 NOTE — Progress Notes (Signed)
HISTORY OF PRESENT ILLNESS  [name] is a 41 y.o. year old female,G2P2002, who presents for a problem visit. The patient has a history of ASCUS Pap smears.  She also was found to have dense breast on her mammogram.  She has a known history of stress urinary incontinence.  Subjective:  The patient is doing well.  Objective:  BP 102/62  Wt 111 lb (50.349 kg)  LMP 10/31/2011   General: no distress GI: soft and nontender  External genitalia: normal general appearance Vaginal: normal without tenderness, induration or masses and relaxation noted Cervix: normal appearance Adnexa: normal bimanual exam Uterus: normal size shape and consistency  Assessment:  Abnormal Pap smears, ascus Dense breast Stress urinary incontinence  Plan:  Pap smear sent.  The patient and I discussed the implications of having dense breast on mammogram.  Only five states in the [place] require that patients be notified of dense breast on mammogram.  [place] recently passed a Social worker requiring notification. Only one state requires that insurance companies pay for follow-up evaluation.  The patient was told that dense breast are an independent risk factor for breast cancer.  She was told that having dense breast does not mean that the patient has breast cancer, nor will develop breast cancer.  It is known that dense breast make it more difficult to read and interpret mammograms.  There are other modalities for evaluating breast.  They include breast ultrasound, tomosynthesis, and MRI.  The patient understands that these tests are expensive, and that her insurance company may or may not pay to have these done. Ultrasound, tomosynthesis, and MRI show more findings that are not cancer, which can result in additional testing and unnecessary biopsies. The patient was offered the previously mentioned tests, but declines further testing at this time. The patient was told that the [employer] of  Obstetricians and Gynecologists recommends annual mammograms starting at age 80.  Breast awareness was reviewed with the patient.  Her questions were answered.  The patient will return for her annual exam.   Return to office in 6 month(s) for her annual exam.   Leonard Schwartz M.D.  11/25/2011 7:49 PM    History of previous cervical treatment:  no treatment  Last pap showed:  atypical squamous cellularity of undetermined significance (ASCUS)  Date 05/07/2011  High Risk HPV present: no  Gardasil received: no   Tobacco use:  No Using Folic Acid: no

## 2011-11-26 LAB — PAP IG W/ RFLX HPV ASCU

## 2011-11-28 LAB — HUMAN PAPILLOMAVIRUS, HIGH RISK: HPV DNA High Risk: NOT DETECTED

## 2011-12-01 NOTE — Progress Notes (Signed)
Quick Note:  Send atypical Pap smear letter with a note to return for repeat Pap in six months. ______ 

## 2011-12-02 ENCOUNTER — Encounter: Payer: Self-pay | Admitting: Obstetrics and Gynecology

## 2011-12-16 ENCOUNTER — Ambulatory Visit (INDEPENDENT_AMBULATORY_CARE_PROVIDER_SITE_OTHER): Payer: BC Managed Care – PPO | Admitting: Family

## 2011-12-16 ENCOUNTER — Encounter: Payer: Self-pay | Admitting: Family

## 2011-12-16 VITALS — BP 120/80 | HR 55 | Temp 99.0°F | Resp 16

## 2011-12-16 DIAGNOSIS — H6692 Otitis media, unspecified, left ear: Secondary | ICD-10-CM | POA: Insufficient documentation

## 2011-12-16 DIAGNOSIS — H6690 Otitis media, unspecified, unspecified ear: Secondary | ICD-10-CM

## 2011-12-16 DIAGNOSIS — Z23 Encounter for immunization: Secondary | ICD-10-CM

## 2011-12-16 DIAGNOSIS — J309 Allergic rhinitis, unspecified: Secondary | ICD-10-CM | POA: Insufficient documentation

## 2011-12-16 MED ORDER — AZITHROMYCIN 250 MG PO TABS: ORAL_TABLET | ORAL | Status: DC

## 2011-12-16 MED ORDER — FLUTICASONE PROPIONATE 50 MCG/ACT NA SUSP: 2.0000 | Freq: Every day | NASAL | Status: DC

## 2011-12-16 NOTE — Assessment & Plan Note (Signed)
Trial of flonase. Continue allegra.

## 2011-12-16 NOTE — Patient Instructions (Addendum)

## 2011-12-16 NOTE — Progress Notes (Signed)
  Subjective:    Patient ID: Melinda Andersen, female    DOB: 05-01-1970, 41 y.o.   MRN: [id number]  HPI  Ms. Lalone is a 41 yr old female who presents today with chief complaint of left ear pain.  Pain started about 1 week ago and is reported as "fulllness."  She reports associated post nasal drip for months.  Post nasal drip is not improved by claritin or allegra. She denies associated fever.     Review of Systems See HPI  Past Medical History  Diagnosis Date  . Erythromelalgia 04/2009    chronic red painful toes--Dr Christen Bame at [facility] (rheumatologist)  . Abnormal Pap smear 05/07/2011    ascus     History   Social History  . Marital Status: Married    Spouse Name: N/A    Number of Children: 2  . Years of Education: N/A   Occupational History  . Not on file.   Social History Main Topics  . Smoking status: Never Smoker   . Smokeless tobacco: Never Used  . Alcohol Use: Not on file  . Drug Use: No  . Sexually Active: Yes    Birth Control/ Protection: Pill   Other Topics Concern  . Not on file   Social History Narrative  . No narrative on file    Past Surgical History  Procedure Date  . Cesarean section 2003 & 2006  . Wisdom tooth extraction   . Oral surgery     Family History  Problem Relation Age of Onset  . Alcohol abuse Father   . Arthritis Maternal Grandmother   . Hyperlipidemia Maternal Grandmother   . Hypertension Maternal Grandmother   . Depression Maternal Grandmother   . Hyperlipidemia Maternal Grandfather   . Kidney disease Maternal Grandfather   . Cancer Other     breast    Allergies  Allergen Reactions  . Penicillins     REACTION: Rash    Current Outpatient Prescriptions on File Prior to Visit  Medication Sig Dispense Refill  . levonorgestrel-ethinyl estradiol (ENPRESSE,TRIVORA) tablet Take 1 tablet by mouth daily.      Marland Kitchen loratadine (CLARITIN) 10 MG tablet Take 10 mg by mouth daily as needed.      . fexofenadine (ALLEGRA) 180 MG tablet Take  180 mg by mouth daily.      . fluticasone (FLONASE) 50 MCG/ACT nasal spray Place 2 sprays into the nose daily.  16 g  3    BP 120/80  Pulse 55  Temp 99 F (37.2 C) (Oral)  Resp 16  SpO2 98%  LMP 11/28/2011       Objective:   Physical Exam  Constitutional: She is oriented to person, place, and time. She appears well-developed and well-nourished. No distress.  HENT:  Head: Normocephalic and atraumatic.  Right Ear: Tympanic membrane and ear canal normal.       Streaking left ear  Cardiovascular: Normal rate and regular rhythm.   No murmur heard. Pulmonary/Chest: Effort normal and breath sounds normal. No respiratory distress. She has no wheezes. She has no rales. She exhibits no tenderness.  Lymphadenopathy:    She has no cervical adenopathy.  Neurological: She is alert and oriented to person, place, and time.  Skin: Skin is warm and dry. No rash noted. No erythema. No pallor.  Psychiatric: She has a normal mood and affect. Her behavior is normal. Judgment and thought content normal.          Assessment & Plan:

## 2011-12-16 NOTE — Assessment & Plan Note (Signed)
Mild L OM.  Rx with zithromax due to penicillin allergy.  She is reminded to use back up protection due to possible OCP interaction.

## 2011-12-22 ENCOUNTER — Telehealth: Payer: Self-pay | Admitting: Internal Medicine

## 2011-12-22 MED ORDER — CEFUROXIME AXETIL 500 MG PO TABS: 500.0000 mg | ORAL_TABLET | Freq: Two times a day (BID) | ORAL | Status: DC

## 2011-12-22 NOTE — Telephone Encounter (Signed)
Caller: [name]; Patient Name: Melinda Andersen; PCP: Peggyann Juba, Melissa (Adults only); [facility] Phone Number: [phone].  Pt reports being seen in the office on 12/16/11 for ear infection.  Pt denies any new symptoms.  Pt has been taking the antibiotic and flonase but ear is still painful (stuffiness).  Pt is afebrile. Triaged patient per Ear Pain Protocol.  See Provider within 24 hours Disposition for 'Constant or intermittent dull earache, throbbing in ear or feeling of fullness; may interfere with sleep or ability to carry out normal activity'.  OFFICE, please follow up with pt if new medication needs to be prescribed or if pt needs to come back in for her symptoms.

## 2011-12-22 NOTE — Telephone Encounter (Signed)
Attempted to reach pt. No answer.  Trish could you please call pt in AM and let her know that I have sent rx for ceftin to her pharmacy? She should start if her symptoms are not better tomorrow.  Use back up birth control.  Let [place] know if no improvement in ear discomfort within 2-3 days after starting.

## 2011-12-23 NOTE — Telephone Encounter (Signed)
Notified pt of instructions below. She reports that symptoms are feeling improved today. She will hold off on antibiotic unless symptoms worsen or do not continue to improve.

## 2012-05-25 ENCOUNTER — Other Ambulatory Visit: Payer: Self-pay | Admitting: Obstetrics and Gynecology

## 2012-05-27 LAB — PAP IG W/ RFLX HPV ASCU

## 2012-06-09 ENCOUNTER — Other Ambulatory Visit: Payer: Self-pay | Admitting: Obstetrics and Gynecology

## 2012-06-09 ENCOUNTER — Other Ambulatory Visit: Payer: Self-pay

## 2012-06-09 DIAGNOSIS — Z1231 Encounter for screening mammogram for malignant neoplasm of breast: Secondary | ICD-10-CM

## 2012-06-11 ENCOUNTER — Telehealth: Payer: Self-pay | Admitting: *Deleted

## 2012-06-11 NOTE — Telephone Encounter (Signed)
I recommend claritin once daily. Call if symptoms worsen or if not  Improved in 1 week. Continue flonase.

## 2012-06-11 NOTE — Telephone Encounter (Signed)
Left detailed message on cell and to call if any questions. 

## 2012-06-11 NOTE — Telephone Encounter (Signed)
Pt left message on voicemail that her allergies were flared up. States she has started using flonase x 3-4 days and it doesn't seem to be helping much. Wants to know what else she can do in addition to that. Left message on cell# to call with symptoms that she is having and any other medications she may be taking for her symptoms.

## 2012-06-11 NOTE — Telephone Encounter (Signed)
Pt left message on voicemail that her allergies were flared up. States she has started using flonase x 3-4 days and it doesn't seem to be helping much. Wants to know what else she can do in addition to that. Left message on cell# to call with symptoms that she is having and any other medications she may be taking for her symptoms. 

## 2012-06-11 NOTE — Telephone Encounter (Signed)
Pt called back stating she has not tried anything else but flonase for her: sneezing, cough, congestion and headace. Reports that she did take ibuprofen for her headache but wants to know what else we may recommend for these symptoms? Please advise.

## 2012-07-13 ENCOUNTER — Ambulatory Visit
Admission: RE | Admit: 2012-07-13 | Discharge: 2012-07-13 | Disposition: A | Discharge status: Home / Self Care | Payer: BC Managed Care – PPO | Source: Ambulatory Visit

## 2012-07-13 DIAGNOSIS — Z1231 Encounter for screening mammogram for malignant neoplasm of breast: Secondary | ICD-10-CM

## 2012-09-09 ENCOUNTER — Ambulatory Visit (INDEPENDENT_AMBULATORY_CARE_PROVIDER_SITE_OTHER): Payer: BC Managed Care – PPO | Admitting: Nurse Practitioner

## 2012-09-09 ENCOUNTER — Encounter: Payer: Self-pay | Admitting: Nurse Practitioner

## 2012-09-09 ENCOUNTER — Telehealth: Payer: Self-pay | Admitting: *Deleted

## 2012-09-09 VITALS — BP 120/84 | HR 59 | Temp 97.9°F | Resp 16 | Ht 60.98 in | Wt 109.1 lb

## 2012-09-09 DIAGNOSIS — Z9109 Other allergy status, other than to drugs and biological substances: Secondary | ICD-10-CM

## 2012-09-09 DIAGNOSIS — H1045 Other chronic allergic conjunctivitis: Secondary | ICD-10-CM

## 2012-09-09 DIAGNOSIS — H1013 Acute atopic conjunctivitis, bilateral: Secondary | ICD-10-CM

## 2012-09-09 MED ORDER — OLOPATADINE HCL 0.1 % OP SOLN: 1.0000 [drp] | Freq: Two times a day (BID) | OPHTHALMIC | Status: DC

## 2012-09-09 MED ORDER — AZELASTINE-FLUTICASONE 137-50 MCG/ACT NA SUSP: 1.0000 | Freq: Two times a day (BID) | NASAL | Status: DC

## 2012-09-09 NOTE — Telephone Encounter (Signed)
Pt decided she would like rxs to go to [facility] instead of [facility]. Cancelled rxs (olopatadine and azelastine-fluticasone) at [facility] with Pam and called them to pharmacist at [facility].

## 2012-09-09 NOTE — Patient Instructions (Signed)
Start nasal antihistamine-it has flonase in it. Use 1 spray each nostril twice daily. Use eye drops as directed. It is important that you begin washing sinuses daily (Neilmed Sinus Rinse)-after packing & unpacking boxes. Feel better!

## 2012-09-09 NOTE — Progress Notes (Signed)
Subjective:     Melinda Andersen is a 42 y.o. female who presents for evaluation and treatment of allergic symptoms. Symptoms include: headaches, itchy eyes, sinus pressure, sneezing and swelling of eyes and are not present in a seasonal pattern. Precipitants include: packing boxes. Treatment currently includes nasal saline and is not effective. She has a prescription for flonase, but is not using it. She has taken allegra and claritin in the past, but has not used either med in at least 1 week. The following portions of the patient's history were reviewed and updated as appropriate: allergies, current medications, past medical history, past surgical history and problem list.  Review of Systems Constitutional: negative for chills, fatigue, fevers and night sweats Eyes: positive for irritation and redness Ears, nose, mouth, throat, and face: positive for sinus pressure, negative for earaches, hoarseness, nasal congestion and sore throat Respiratory: negative for asthma, cough and wheezing Cardiovascular: negative for chest pressure/discomfort, irregular heart beat, lower extremity edema and palpitations Gastrointestinal: negative for abdominal pain and diarrhea Allergic/Immunologic: positive for seasonal allergies    Objective:    BP 120/84  Pulse 59  Temp(Src) 97.9 F (36.6 C) (Oral)  Resp 16  Ht 5' 0.98" (1.549 m)  Wt 109 lb 1.9 oz (49.497 kg)  BMI 20.63 kg/m2  SpO2 99% General appearance: alert, cooperative, appears stated age and no distress Head: Normocephalic, without obvious abnormality, atraumatic Eyes: negative findings: lids and lashes normal, corneas clear and pupils equal, round, reactive to light and accomodation, positive findings: sclera injected, bilaterally Ears: normal TM's and external ear canals both ears and both TM retracted Nose: Nares normal. Septum midline. Mucosa normal. No drainage or sinus tenderness. Throat: lips, mucosa, and tongue normal; teeth and gums  normal Lungs: clear to auscultation bilaterally Heart: regular rate and rhythm, S1, S2 normal, no murmur, click, rub or gallop Lymph nodes: Cervical, supraclavicular, and axillary nodes normal.    Assessment:    Environmental Allergies.    Plan:    Medications: intranasal steroids: sinus rinses daily, intranasal antihistamines: ., .. Allergen avoidance discussed. Follow-up if no improvement.Marland Kitchen

## 2012-10-29 ENCOUNTER — Ambulatory Visit: Payer: Self-pay | Admitting: Family

## 2012-10-29 ENCOUNTER — Telehealth: Payer: Self-pay | Admitting: Internal Medicine

## 2012-10-29 NOTE — Telephone Encounter (Signed)
Patient Information:  Caller Name: [place]  Phone: [phone]  Patient: Melinda Andersen, Melinda Andersen  Gender: Female  DOB: 03/29/1970  Age: 42 Years  PCP: Charlynn Court (Pt reported Sandford Craze)  Pregnant: No  Office Follow Up:  Does the office need to follow up with this patient?: No  Instructions For The Office: N/A  RN Note:  Oral contraception.  Some large weeping blisters present.    Symptoms  Reason For Call & Symptoms: Suspected poison ivy on bilateral feet and lower legs. Worsening linear rash with itchy weeping blisters.  Reviewed Health History In EMR: Yes  Reviewed Medications In EMR: Yes  Reviewed Allergies In EMR: Yes  Reviewed Surgeries / Procedures: Yes  Date of Onset of Symptoms: 10/21/2012  Treatments Tried: Caladryl, Cortisone cream,  Benadryl hs  Treatments Tried Worked: No OB / GYN:  LMP: 10/01/2012  Guideline(s) Used:  Poison Ivy - Oak or [place]  Disposition Per Guideline:   See Today in Office  Reason For Disposition Reached:   Big blisters or oozing sores  Advice Given:  Hydrocortisone Cream for Itching:   Apply 1% hydrocortisone cream 4 times a day to reduce itching. Use it for 5 days.  Keep the cream in the refrigerator (Reason: it feels better if applied cold).  Apply Cold to the Area:  Soak the involved area in cool water for 20 minutes or massage it with an ice cube as often as necessary to reduce itching and oozing.  Oral Antihistamine Medication for Itching:   Antihistamines may cause sleepiness. Do not drink, drive, or operate dangerous machinery while taking antihistamines.  An over-the-counter antihistamine that causes less sleepiness is loratadine (e.g., Alavert or Claritin).  Avoid Scratching:   Cut your fingernails short and try not to scratch so as to prevent a secondary infection from bacteria.  New Blisters Appear:  If new blisters occur several days after the first ones, you probably have had ongoing contact with the irritating plant  oil. To prevent recurrences: bathe all dogs and wash all clothes and shoes that were with you on the day of exposure.  Contagiousness:  Poison ivy or oak is not contagious to others.  Expected Course:  Usually lasts 2 weeks. Treatment reduces the severity of the symptoms, not how long they last.  Call Back If:  Rash lasts longer than 3 weeks  It looks infected  You become worse.  Patient Will Follow Care Advice:  YES  Appointment Scheduled:  10/29/2012 13:15:00 Appointment Scheduled Provider:  Sandford Craze (Adults only)

## 2013-01-29 ENCOUNTER — Encounter: Payer: Self-pay | Admitting: Family Medicine

## 2013-01-29 ENCOUNTER — Ambulatory Visit (INDEPENDENT_AMBULATORY_CARE_PROVIDER_SITE_OTHER): Payer: BC Managed Care – PPO | Admitting: Family Medicine

## 2013-01-29 VITALS — BP 120/80 | Temp 98.4°F | Wt 110.0 lb

## 2013-01-29 DIAGNOSIS — R300 Dysuria: Secondary | ICD-10-CM

## 2013-01-29 DIAGNOSIS — N390 Urinary tract infection, site not specified: Secondary | ICD-10-CM | POA: Insufficient documentation

## 2013-01-29 LAB — POCT URINALYSIS DIPSTICK
Bilirubin, UA: NEGATIVE
Glucose, UA: NEGATIVE
Ketones, UA: NEGATIVE
Nitrite, UA: NEGATIVE
Protein, UA: NEGATIVE
Spec Grav, UA: 1.01
Urobilinogen, UA: NEGATIVE
pH, UA: 6

## 2013-01-29 MED ORDER — SULFAMETHOXAZOLE-TMP DS 800-160 MG PO TABS: 1.0000 | ORAL_TABLET | Freq: Two times a day (BID) | ORAL | Status: DC

## 2013-01-29 NOTE — Progress Notes (Signed)
Dysuria: yes, urgency, frequency, feeling of incomplete voiding, burning duration of symptoms:yesterday abdominal pain:no fevers:no back pain:no Vomiting:no U/a d/w pt.   No known h/o frequent UTIs.  Certainly none recently.    Meds, vitals, and allergies reviewed.   ROS: See HPI.  Otherwise negative.    GEN: nad, alert and oriented HEENT: mucous membranes moist NECK: supple CV: rrr.  PULM: ctab, no inc wob ABD: soft, +bs, suprapubic area mildly tender EXT: no edema SKIN: no acute rash BACK: no CVA pain

## 2013-01-29 NOTE — Assessment & Plan Note (Signed)
Nontoxic, ucx, septra, f/u prn. D/w pt.  She agrees.

## 2013-01-29 NOTE — Patient Instructions (Signed)
Drink plenty of water and start the antibiotics today.  We'll contact you with your lab report.  Take care.   Use back up birth control in the meantime.

## 2013-02-02 ENCOUNTER — Encounter: Payer: Self-pay | Admitting: Family Medicine

## 2013-05-12 ENCOUNTER — Telehealth: Payer: Self-pay | Admitting: Nurse Practitioner

## 2013-05-12 NOTE — Telephone Encounter (Signed)
Message copied by Carmelia BakeMERLIN, DIANE S on Thu May 12, 2013  2:54 PM ------      Message from: WEAVER, LAYNE COX      Created: Thu May 12, 2013 11:36 AM       She may schedule CPE any time.      ----- Message -----         From: Carmelia Bakeiane S Tomerlin         Sent: 05/05/2013   2:07 PM           To: Kelle DartingLayne C Weaver, NP            Patient wants to you to be her PCP. When do you recommend she schedule her next appointment?      Diane      ----- Message -----         From: Kelle DartingLayne C Weaver, NP         Sent: 05/05/2013   1:10 PM           To: Veryl Speakiane S Tomerlin            ? She is not my pt. I saw her for an acute last July. She saw Dr Para Marchuncan [place]. Need to gather more info.      ----- Message -----         From: Carmelia Bakeiane S Tomerlin         Sent: 05/05/2013  11:39 AM           To: Kelle DartingLayne C Weaver, NP            When should I schedule the patient to come in? Do I schedule it as a checkup or physical? Thanks, Diane       ----- Message -----         From: Sandford CrazeMelissa O'Sullivan, NP         Sent: 04/22/2013  10:02 AM           To: Carmelia Bakeiane S Tomerlin            Ok with me.       ----- Message -----         From: Carmelia Bakeiane S Tomerlin         Sent: 04/22/2013   8:10 AM           To: Kelle DartingLayne C Weaver, NP, Sandford CrazeMelissa O'Sullivan, NP            Patient would like to switch PCP. [place] [facility]                               ------

## 2013-05-12 NOTE — Telephone Encounter (Signed)
LM for pt to call & schedule CPE.

## 2013-06-07 ENCOUNTER — Other Ambulatory Visit: Payer: Self-pay

## 2013-06-07 DIAGNOSIS — Z1231 Encounter for screening mammogram for malignant neoplasm of breast: Secondary | ICD-10-CM

## 2013-07-15 ENCOUNTER — Ambulatory Visit
Admission: RE | Admit: 2013-07-15 | Discharge: 2013-07-15 | Disposition: A | Discharge status: Home / Self Care | Payer: BC Managed Care – PPO | Source: Ambulatory Visit

## 2013-07-15 DIAGNOSIS — Z1231 Encounter for screening mammogram for malignant neoplasm of breast: Secondary | ICD-10-CM

## 2013-12-14 ENCOUNTER — Ambulatory Visit (INDEPENDENT_AMBULATORY_CARE_PROVIDER_SITE_OTHER): Payer: BC Managed Care – PPO | Admitting: Nurse Practitioner

## 2013-12-14 ENCOUNTER — Encounter: Payer: Self-pay | Admitting: Nurse Practitioner

## 2013-12-14 VITALS — BP 104/60 | HR 63 | Temp 98.1°F | Resp 18 | Ht 60.0 in | Wt 110.0 lb

## 2013-12-14 DIAGNOSIS — Z9109 Other allergy status, other than to drugs and biological substances: Secondary | ICD-10-CM

## 2013-12-14 DIAGNOSIS — Z91048 Other nonmedicinal substance allergy status: Secondary | ICD-10-CM

## 2013-12-14 DIAGNOSIS — J069 Acute upper respiratory infection, unspecified: Secondary | ICD-10-CM

## 2013-12-14 DIAGNOSIS — Z23 Encounter for immunization: Secondary | ICD-10-CM

## 2013-12-14 MED ORDER — AZELASTINE-FLUTICASONE 137-50 MCG/ACT NA SUSP: 1.0000 | Freq: Two times a day (BID) | NASAL | Status: DC

## 2013-12-14 NOTE — Progress Notes (Signed)
Pre visit review using our clinic review tool, if applicable. No additional management support is needed unless otherwise documented below in the visit note. 

## 2013-12-14 NOTE — Patient Instructions (Addendum)
You have a virus that has exacerbated your underlying allergy symptoms. The average duration of cold symptoms is 14 days. Start daily sinus rinses (neilmed Sinus Rinse). Use 30 mg to 60 mg pseudoephedrine twice daily. Resume azelastine nasal spray. Apply vicks vapor rub under nose at night. Sip fluids every hour. Rest. If you are not feeling better in 1 week or develop fever or chest pain, call us for re-evaluation. Feel better!  Upper Respiratory Infection, Adult An upper respiratory infection (URI) is also sometimes known as the common cold. The upper respiratory tract includes the nose, sinuses, throat, trachea, and bronchi. Bronchi are the airways leading to the lungs. Most people improve within 1 week, but symptoms can last up to 2 weeks. A residual cough may last even longer.  CAUSES Many different viruses can infect the tissues lining the upper respiratory tract. The tissues become irritated and inflamed and often become very moist. Mucus production is also common. A cold is contagious. You can easily spread the virus to others by oral contact. This includes kissing, sharing a glass, coughing, or sneezing. Touching your mouth or nose and then touching a surface, which is then touched by another person, can also spread the virus. SYMPTOMS  Symptoms typically develop 1 to 3 days after you come in contact with a cold virus. Symptoms vary from person to person. They may include:  Runny nose.  Sneezing.  Nasal congestion.  Sinus irritation.  Sore throat.  Loss of voice (laryngitis).  Cough.  Fatigue.  Muscle aches.  Loss of appetite.  Headache.  Low-grade fever. DIAGNOSIS  You might diagnose your own cold based on familiar symptoms, since most people get a cold 2 to 3 times a year. Your caregiver can confirm this based on your exam. Most importantly, your caregiver can check that your symptoms are not due to another disease such as strep throat, sinusitis, pneumonia, asthma, or  epiglottitis. Blood tests, throat tests, and X-rays are not necessary to diagnose a common cold, but they may sometimes be helpful in excluding other more serious diseases. Your caregiver will decide if any further tests are required. RISKS AND COMPLICATIONS  You may be at risk for a more severe case of the common cold if you smoke cigarettes, have chronic heart disease (such as heart failure) or lung disease (such as asthma), or if you have a weakened immune system. The very young and very old are also at risk for more serious infections. Bacterial sinusitis, middle ear infections, and bacterial pneumonia can complicate the common cold. The common cold can worsen asthma and chronic obstructive pulmonary disease (COPD). Sometimes, these complications can require emergency medical care and may be life-threatening. PREVENTION  The best way to protect against getting a cold is to practice good hygiene. Avoid oral or hand contact with people with cold symptoms. Wash your hands often if contact occurs. There is no clear evidence that vitamin C, vitamin E, echinacea, or exercise reduces the chance of developing a cold. However, it is always recommended to get plenty of rest and practice good nutrition. TREATMENT  Treatment is directed at relieving symptoms. There is no cure. Antibiotics are not effective, because the infection is caused by a virus, not by bacteria. Treatment may include:  Increased fluid intake. Sports drinks offer valuable electrolytes, sugars, and fluids.  Breathing heated mist or steam (vaporizer or shower).  Eating chicken soup or other clear broths, and maintaining good nutrition.  Getting plenty of rest.  Using gargles or lozenges  for comfort.  Controlling fevers with ibuprofen or acetaminophen as directed by your caregiver.  Increasing usage of your inhaler if you have asthma. Zinc gel and zinc lozenges, taken in the first 24 hours of the common cold, can shorten the duration  and lessen the severity of symptoms. Pain medicines may help with fever, muscle aches, and throat pain. A variety of non-prescription medicines are available to treat congestion and runny nose. Your caregiver can make recommendations and may suggest nasal or lung inhalers for other symptoms.  HOME CARE INSTRUCTIONS   Only take over-the-counter or prescription medicines for pain, discomfort, or fever as directed by your caregiver.  Use a warm mist humidifier or inhale steam from a shower to increase air moisture. This may keep secretions moist and make it easier to breathe.  Drink enough water and fluids to keep your urine clear or pale yellow.  Rest as needed.  Return to work when your temperature has returned to normal or as your caregiver advises. You may need to stay home longer to avoid infecting others. You can also use a face mask and careful hand washing to prevent spread of the virus. SEEK MEDICAL CARE IF:   After the first few days, you feel you are getting worse rather than better.  You need your caregiver's advice about medicines to control symptoms.  You develop chills, worsening shortness of breath, or brown or red sputum. These may be signs of pneumonia.  You develop yellow or brown nasal discharge or pain in the face, especially when you bend forward. These may be signs of sinusitis.  You develop a fever, swollen neck glands, pain with swallowing, or white areas in the back of your throat. These may be signs of strep throat. SEEK IMMEDIATE MEDICAL CARE IF:   You have a fever.  You develop severe or persistent headache, ear pain, sinus pain, or chest pain.  You develop wheezing, a prolonged cough, cough up blood, or have a change in your usual mucus (if you have chronic lung disease).  You develop sore muscles or a stiff neck. Document Released: 08/13/2000 Document Revised: 05/12/2011 Document Reviewed: 06/21/2010 [facility] Patient Information 2014 [employer] [place].

## 2013-12-14 NOTE — Progress Notes (Signed)
   Subjective:    Patient ID: Melinda CourierKelly L Fitzwater, female    DOB: 06/26/1970, 43 y.o.   MRN: [id number]  HPI Comments: Pt has underlying fall & spring allergies, which she thinks have flared in last 2-3 weeks. In last 3 days, nasal congestion has been worse-stuffy at night, can't breathe through nose, & HA.  Sinusitis This is a new problem. The current episode started in the past 7 days. The problem has been gradually worsening since onset. There has been no fever. The pain is mild. Associated symptoms include congestion, headaches, sinus pressure and sneezing. Pertinent negatives include no chills, coughing, ear pain, hoarse voice, shortness of breath, sore throat or swollen glands. Treatments tried: mucinex OTC. The treatment provided mild relief.      Review of Systems  Constitutional: Negative for fever, chills, activity change, appetite change and fatigue.  HENT: Positive for congestion, sinus pressure and sneezing. Negative for ear pain, hoarse voice and sore throat.        Ears itching   Respiratory: Negative for cough, chest tightness, shortness of breath, wheezing and stridor.   Neurological: Positive for headaches.       Objective:   Physical Exam  Vitals reviewed. Constitutional: She is oriented to person, place, and time. She appears well-developed and well-nourished. No distress.  HENT:  Head: Normocephalic and atraumatic.  Right Ear: External ear normal.  Left Ear: External ear normal.  Mouth/Throat: Oropharynx is clear and moist. No oropharyngeal exudate.  TM retraction, bilaterally  Eyes: Conjunctivae are normal. Right eye exhibits no discharge. Left eye exhibits no discharge.  Neck: Normal range of motion. Neck supple. No thyromegaly present.  Cardiovascular: Normal rate, regular rhythm and normal heart sounds.   No murmur heard. Pulmonary/Chest: Effort normal and breath sounds normal. No respiratory distress. She has no wheezes. She has no rales.  Lymphadenopathy:   She has no cervical adenopathy.  Neurological: She is alert and oriented to person, place, and time.  Skin: Skin is warm and dry.  Psychiatric: She has a normal mood and affect. Her behavior is normal. Thought content normal.          Assessment & Plan:  1. Acute upper respiratory infection See pt instructions.  2. Environmental allergies - Azelastine-Fluticasone 137-50 MCG/ACT SUSP; Place 1 spray into the nose 2 (two) times daily.  Dispense: 1 Bottle; Refill: 6  F/u PRN

## 2013-12-22 ENCOUNTER — Ambulatory Visit (INDEPENDENT_AMBULATORY_CARE_PROVIDER_SITE_OTHER): Payer: BC Managed Care – PPO | Admitting: Nurse Practitioner

## 2013-12-22 ENCOUNTER — Encounter: Payer: Self-pay | Admitting: Nurse Practitioner

## 2013-12-22 ENCOUNTER — Telehealth: Payer: Self-pay | Admitting: Nurse Practitioner

## 2013-12-22 VITALS — BP 144/84 | HR 61 | Temp 98.4°F | Ht 60.0 in | Wt 109.0 lb

## 2013-12-22 DIAGNOSIS — J0111 Acute recurrent frontal sinusitis: Secondary | ICD-10-CM

## 2013-12-22 DIAGNOSIS — H9203 Otalgia, bilateral: Secondary | ICD-10-CM

## 2013-12-22 MED ORDER — DOXYCYCLINE HYCLATE 100 MG PO TABS: 100.0000 mg | ORAL_TABLET | Freq: Two times a day (BID) | ORAL | Status: DC

## 2013-12-22 MED ORDER — METHYLPREDNISOLONE ACETATE 40 MG/ML IJ SUSP
40.0000 mg | Freq: Once | INTRAMUSCULAR | Status: AC
  Administered 2013-12-22: 40 mg via INTRAMUSCULAR

## 2013-12-22 NOTE — Telephone Encounter (Signed)
Noted. Pt already seen for appt.

## 2013-12-22 NOTE — Progress Notes (Signed)
Pre visit review using our clinic review tool, if applicable. No additional management support is needed unless otherwise documented below in the visit note. 

## 2013-12-22 NOTE — Telephone Encounter (Signed)
Patient Information:  Caller Name: Tresa EndoKelly  Phone: [phone]  Patient: Melinda Andersen, Melinda Andersen  Gender: Female  DOB: 07/16/1970  Age: 43 Years  PCP: Sandford Craze'Sullivan, Melissa (Adults only)  Pregnant: No  Office Follow Up:  Does the office need to follow up with this patient?: No  Instructions For The Office: N/A  RN Note:  Appt scheduled for today(12/22/13) @ 1330 with Maximino SarinLayne Weaver NP  Symptoms  Reason For Call & Symptoms: Calling because ears are still clogged and still can't hear well--feels like she is in a drum, nasal congestion, loose cough. Was seen at the office on 12/14/13 and advised to use home treatments. Some sxs are getting better but hearing isn't.  Reviewed Health History In EMR: Yes  Reviewed Medications In EMR: Yes  Reviewed Allergies In EMR: Yes  Reviewed Surgeries / Procedures: Yes  Date of Onset of Symptoms: 12/12/2013  Treatments Tried: Neti pot, Flonase ,Sudafed  Treatments Tried Worked: No OB / GYN:  LMP: 12/22/2013  Guideline(s) Used:  Ear - Congestion  Hearing Loss  Disposition Per Guideline:   See Today in Office  Reason For Disposition Reached:   Patient wants to be seen  Advice Given:  Call Back If:   You become worse.  Patient Will Follow Care Advice:  YES  Appointment Scheduled:  12/22/2013 13:30:00 Appointment Scheduled Provider:  Maximino SarinWeaver, Layne

## 2013-12-22 NOTE — Patient Instructions (Signed)
  Start antibiotic. Eat yogurt daily to help prevent antibiotic -associated diarrhea.  Continue pseudoephedrine for 5 more days.  Continue azelastine daily.  Use vick's vapor rub under nose at night.  Use mechanical method several times daily-10 to help improve eustacian tube function.  Give this another week to 10 days.  Let us know if you develop fever.

## 2013-12-22 NOTE — Progress Notes (Signed)
   Subjective:    Patient ID: Melinda Andersen, female    DOB: 04/17/1970, 43 y.o.   MRN: [id number]  HPI Comments: Pt was seen 1 week ago w/3 days of nasal congestion & sore throat. Today she is tearful that she does not feel well & "doesn't have time to be sick".  Otalgia  There is pain in both (ears feel stopped up) ears. This is a new (about 10 days) problem. The current episode started 1 to 4 weeks ago (10 d). The problem occurs constantly. The problem has been gradually worsening. There has been no fever. The pain is mild. Associated symptoms include coughing and headaches. Pertinent negatives include no abdominal pain or vomiting. Associated symptoms comments: Nasal congestion. She has tried NSAIDs (pseudoephedrine, flonase, azelastine) for the symptoms. The treatment provided mild relief.      Review of Systems  Constitutional: Positive for fatigue. Negative for fever and chills.  HENT: Positive for congestion, ear pain and sinus pressure.   Respiratory: Positive for cough. Negative for choking, chest tightness, shortness of breath, wheezing and stridor.   Gastrointestinal: Negative for nausea, vomiting and abdominal pain.  Neurological: Positive for headaches.       Objective:   Physical Exam  Vitals reviewed. Constitutional: She is oriented to person, place, and time. She appears well-developed and well-nourished. No distress.  HENT:  Head: Normocephalic and atraumatic.  Right Ear: External ear normal.  Left Ear: External ear normal.  Mouth/Throat: Oropharynx is clear and moist. No oropharyngeal exudate.  R TM retraction. Bones visible, no fluid. Nml L TM  Eyes: Conjunctivae are normal. Right eye exhibits no discharge. Left eye exhibits no discharge.  Cardiovascular: Normal rate.   Pulmonary/Chest: Effort normal.  Neurological: She is alert and oriented to person, place, and time.  Skin: Skin is warm and dry.  Psychiatric: She has a normal mood and affect. Her behavior is  normal. Thought content normal.          Assessment & Plan:  1. Acute recurrent frontal sinusitis - doxycycline (VIBRA-TABS) 100 MG tablet; Take 1 tablet (100 mg total) by mouth 2 (two) times daily.  Dispense: 14 tablet; Refill: 0  2. Otalgia of both ears - methylPREDNISolone acetate (DEPO-MEDROL) injection 40 mg; Inject 1 mL (40 mg total) into the muscle once.  See pt instructions.

## 2014-01-02 ENCOUNTER — Encounter: Payer: Self-pay | Admitting: Nurse Practitioner

## 2014-06-13 ENCOUNTER — Other Ambulatory Visit: Payer: Self-pay

## 2014-06-13 DIAGNOSIS — Z1231 Encounter for screening mammogram for malignant neoplasm of breast: Secondary | ICD-10-CM

## 2014-06-23 ENCOUNTER — Encounter: Payer: Self-pay | Admitting: Nurse Practitioner

## 2014-06-23 DIAGNOSIS — Z0000 Encounter for general adult medical examination without abnormal findings: Secondary | ICD-10-CM | POA: Insufficient documentation

## 2014-07-21 ENCOUNTER — Ambulatory Visit
Admission: RE | Admit: 2014-07-21 | Discharge: 2014-07-21 | Disposition: A | Discharge status: Home / Self Care | Payer: BLUE CROSS/BLUE SHIELD | Source: Ambulatory Visit

## 2014-07-21 DIAGNOSIS — Z1231 Encounter for screening mammogram for malignant neoplasm of breast: Secondary | ICD-10-CM

## 2015-06-21 ENCOUNTER — Other Ambulatory Visit: Payer: Self-pay

## 2015-06-21 DIAGNOSIS — Z1231 Encounter for screening mammogram for malignant neoplasm of breast: Secondary | ICD-10-CM

## 2015-07-17 DIAGNOSIS — Z01411 Encounter for gynecological examination (general) (routine) with abnormal findings: Secondary | ICD-10-CM | POA: Diagnosis not present

## 2015-07-17 DIAGNOSIS — R1900 Intra-abdominal and pelvic swelling, mass and lump, unspecified site: Secondary | ICD-10-CM | POA: Diagnosis not present

## 2015-07-17 DIAGNOSIS — Z124 Encounter for screening for malignant neoplasm of cervix: Secondary | ICD-10-CM | POA: Diagnosis not present

## 2015-07-17 DIAGNOSIS — Z681 Body mass index (BMI) 19 or less, adult: Secondary | ICD-10-CM | POA: Diagnosis not present

## 2015-07-19 ENCOUNTER — Encounter: Payer: Self-pay | Admitting: Family Medicine

## 2015-07-26 ENCOUNTER — Ambulatory Visit
Admission: RE | Admit: 2015-07-26 | Discharge: 2015-07-26 | Disposition: A | Discharge status: Home / Self Care | Payer: BLUE CROSS/BLUE SHIELD | Source: Ambulatory Visit

## 2015-07-26 DIAGNOSIS — Z1231 Encounter for screening mammogram for malignant neoplasm of breast: Secondary | ICD-10-CM

## 2015-09-21 DIAGNOSIS — L0291 Cutaneous abscess, unspecified: Secondary | ICD-10-CM | POA: Diagnosis not present

## 2016-01-21 DIAGNOSIS — H1013 Acute atopic conjunctivitis, bilateral: Secondary | ICD-10-CM | POA: Diagnosis not present

## 2016-01-21 DIAGNOSIS — H5213 Myopia, bilateral: Secondary | ICD-10-CM | POA: Diagnosis not present

## 2016-01-21 DIAGNOSIS — H52223 Regular astigmatism, bilateral: Secondary | ICD-10-CM | POA: Diagnosis not present

## 2016-01-21 DIAGNOSIS — H524 Presbyopia: Secondary | ICD-10-CM | POA: Diagnosis not present

## 2016-03-31 DIAGNOSIS — Z23 Encounter for immunization: Secondary | ICD-10-CM | POA: Diagnosis not present

## 2016-07-21 DIAGNOSIS — Z1231 Encounter for screening mammogram for malignant neoplasm of breast: Secondary | ICD-10-CM | POA: Diagnosis not present

## 2016-07-21 DIAGNOSIS — Z309 Encounter for contraceptive management, unspecified: Secondary | ICD-10-CM | POA: Diagnosis not present

## 2016-07-21 DIAGNOSIS — Z124 Encounter for screening for malignant neoplasm of cervix: Secondary | ICD-10-CM | POA: Diagnosis not present

## 2016-07-21 DIAGNOSIS — R87612 Low grade squamous intraepithelial lesion on cytologic smear of cervix (LGSIL): Secondary | ICD-10-CM | POA: Diagnosis not present

## 2016-07-21 DIAGNOSIS — Z01411 Encounter for gynecological examination (general) (routine) with abnormal findings: Secondary | ICD-10-CM | POA: Diagnosis not present

## 2016-08-12 ENCOUNTER — Encounter: Payer: Self-pay | Admitting: Family Medicine

## 2016-08-12 DIAGNOSIS — R87612 Low grade squamous intraepithelial lesion on cytologic smear of cervix (LGSIL): Secondary | ICD-10-CM | POA: Diagnosis not present

## 2016-10-27 DIAGNOSIS — Z1389 Encounter for screening for other disorder: Secondary | ICD-10-CM | POA: Diagnosis not present

## 2016-10-27 DIAGNOSIS — Z0000 Encounter for general adult medical examination without abnormal findings: Secondary | ICD-10-CM | POA: Diagnosis not present

## 2017-02-16 DIAGNOSIS — J029 Acute pharyngitis, unspecified: Secondary | ICD-10-CM | POA: Diagnosis not present

## 2017-05-18 DIAGNOSIS — H524 Presbyopia: Secondary | ICD-10-CM | POA: Diagnosis not present

## 2017-05-18 DIAGNOSIS — H5213 Myopia, bilateral: Secondary | ICD-10-CM | POA: Diagnosis not present

## 2017-05-18 DIAGNOSIS — H52223 Regular astigmatism, bilateral: Secondary | ICD-10-CM | POA: Diagnosis not present

## 2017-08-06 DIAGNOSIS — Z1231 Encounter for screening mammogram for malignant neoplasm of breast: Secondary | ICD-10-CM | POA: Diagnosis not present

## 2017-08-06 DIAGNOSIS — Z6820 Body mass index (BMI) 20.0-20.9, adult: Secondary | ICD-10-CM | POA: Diagnosis not present

## 2017-08-06 DIAGNOSIS — R87610 Atypical squamous cells of undetermined significance on cytologic smear of cervix (ASC-US): Secondary | ICD-10-CM | POA: Diagnosis not present

## 2017-08-06 DIAGNOSIS — Z8742 Personal history of other diseases of the female genital tract: Secondary | ICD-10-CM | POA: Diagnosis not present

## 2017-08-06 DIAGNOSIS — Z01411 Encounter for gynecological examination (general) (routine) with abnormal findings: Secondary | ICD-10-CM | POA: Diagnosis not present

## 2017-08-06 DIAGNOSIS — Z3040 Encounter for surveillance of contraceptives, unspecified: Secondary | ICD-10-CM | POA: Diagnosis not present

## 2018-02-09 DIAGNOSIS — L710 Perioral dermatitis: Secondary | ICD-10-CM | POA: Diagnosis not present

## 2018-02-09 DIAGNOSIS — L578 Other skin changes due to chronic exposure to nonionizing radiation: Secondary | ICD-10-CM | POA: Diagnosis not present

## 2018-07-29 ENCOUNTER — Other Ambulatory Visit: Payer: Self-pay

## 2018-08-05 ENCOUNTER — Ambulatory Visit (INDEPENDENT_AMBULATORY_CARE_PROVIDER_SITE_OTHER): Payer: BC Managed Care – PPO | Admitting: Obstetrics & Gynecology

## 2018-08-05 ENCOUNTER — Other Ambulatory Visit: Payer: Self-pay

## 2018-08-05 ENCOUNTER — Encounter: Payer: Self-pay | Admitting: Obstetrics & Gynecology

## 2018-08-05 ENCOUNTER — Other Ambulatory Visit (HOSPITAL_COMMUNITY)
Admission: RE | Admit: 2018-08-05 | Discharge: 2018-08-05 | Disposition: A | Discharge status: Home / Self Care | Payer: BC Managed Care – PPO | Source: Ambulatory Visit | Attending: Obstetrics & Gynecology | Admitting: Obstetrics & Gynecology

## 2018-08-05 VITALS — BP 136/86 | HR 72 | Temp 98.2°F | Ht 61.0 in | Wt 110.0 lb

## 2018-08-05 DIAGNOSIS — Z124 Encounter for screening for malignant neoplasm of cervix: Secondary | ICD-10-CM | POA: Diagnosis not present

## 2018-08-05 DIAGNOSIS — Z01419 Encounter for gynecological examination (general) (routine) without abnormal findings: Secondary | ICD-10-CM

## 2018-08-05 DIAGNOSIS — Z1211 Encounter for screening for malignant neoplasm of colon: Secondary | ICD-10-CM

## 2018-08-05 MED ORDER — NORETHINDRONE ACET-ETHINYL EST 1-20 MG-MCG PO TABS: 1.0000 | ORAL_TABLET | Freq: Every day | ORAL | 4 refills | Status: DC

## 2018-08-05 NOTE — Progress Notes (Signed)
48 y.o. [id number] [name] here for new patient annual exam.  On OCPs.  Cycles are regular.    Patient's last menstrual period was 07/28/2018 (exact date).          Sexually active: Yes.    The current method of family planning is OCP (estrogen/progesterone).    Exercising: Yes.    walk, strength training  Smoker:  no  Health Maintenance: Pap:  08/06/17 ASCUS. HR HPV: Neg   History of abnormal Pap:  yes MMG:  08/06/2017 BIRADS2:Benign. f/u 1 year  Colonoscopy:  1991 BMD:   Years ago  TDaP:  2010 (we discussed this is due and she is aware that an update is due if she has an exposure) Screening Labs: sees Dr. Clelia CroftShaw   reports that she has never smoked. She has never used smokeless tobacco. She reports current alcohol use of about 3.0 - 4.0 standard drinks of alcohol per week. She reports that she does not use drugs.  Past Medical History:  Diagnosis Date  . Abnormal Pap smear 05/07/2011   [facility]   . Erythromelalgia (HCC) 04/2009   [facility]  . Hx of colposcopy with cervical biopsy 2004; 08/12/16   2004 beningn.  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 CESAREAN SECTION  2003 & 2006  . COLONOSCOPY  1991   Fissure  . oral surgery    . WISDOM TOOTH EXTRACTION      Current Outpatient Medications  Medication Sig Dispense Refill  . aspirin EC 81 MG tablet Take 81 mg by mouth daily.    . cholecalciferol (VITAMIN D3) 25 MCG (1000 UT) tablet Take 1,000 Units by mouth daily.    Marland Kitchen. levonorgestrel-ethinyl estradiol (ENPRESSE,TRIVORA) tablet Take 1 tablet by mouth daily.    Marland Kitchen. loratadine (CLARITIN) 10 MG tablet daily.     No current facility-administered medications for this visit.     Family History  Problem Relation Age of Onset  . Alcohol abuse Father   . Arthritis Maternal Grandmother   . Hyperlipidemia Maternal Grandmother   . Hypertension  Maternal Grandmother   . Depression Maternal Grandmother   . Hyperlipidemia Maternal Grandfather   . Kidney disease Maternal Grandfather   . Heart failure Maternal Grandfather   . Cancer Other        breast  . Breast cancer Maternal Aunt   . Colon cancer Maternal Aunt     Review of Systems  All other systems reviewed and are negative.   Exam:   BP 136/86   Pulse 72   Temp 98.2 F (36.8 C) (Temporal)   Ht 5\' 1"  (1.549 m)   Wt 110 lb (49.9 kg)   LMP 07/28/2018 (Exact Date)   BMI 20.78 kg/m    Height: 5\' 1"  (154.9 cm)  Ht Readings from Last 3 Encounters:  08/05/18 5\' 1"  (1.549 m)  12/22/13 5' (1.524 m)  12/14/13 5' (1.524 m)    General appearance: alert, cooperative and appears stated age Head: Normocephalic, without obvious abnormality, atraumatic Neck: no adenopathy, supple, symmetrical, trachea midline and thyroid normal to inspection and palpation Lungs: clear to auscultation bilaterally Breasts: normal appearance, no masses or tenderness Heart: regular rate and rhythm Abdomen: soft, non-tender; bowel sounds normal; no masses,  no organomegaly, bulge in low RLQ c/w indirect hernia Extremities: extremities normal, atraumatic, no cyanosis or edema Skin: Skin color, texture,  turgor normal. No rashes or lesions Lymph nodes: Cervical, supraclavicular, and axillary nodes normal. No abnormal inguinal nodes palpated Neurologic: Grossly normal   Pelvic: External genitalia:  no lesions              Urethra:  normal appearing urethra with no masses, tenderness or lesions              Bartholins and Skenes: normal                 Vagina: normal appearing vagina with normal color and discharge, no lesions              Cervix: no lesions              Pap taken: Yes.   Bimanual Exam:  Uterus:  normal size, contour, position, consistency, mobility, non-tender              Adnexa: normal adnexa and no mass, fullness, tenderness               Rectovaginal: Confirms                Anus:  normal sphincter tone, no lesions  Chaperone was present for exam.  A:  Well Woman with normal exam H/o LGSIL pap 2018 On OCPs Likely indirect hernia  P:   Mammogram guidelines reviewed.  Doing yearly. pap smear and HR HPV obtained today IFOB given Aware TDap due, declines today.  Aware should receive with any exposure. CT and general surgery referral discussed.  As this is reducible, she desires to wait and knows to call with any changes. return annually or prn

## 2018-08-06 LAB — CYTOLOGY - PAP
Diagnosis: NEGATIVE
HPV: NOT DETECTED

## 2018-08-18 ENCOUNTER — Other Ambulatory Visit: Payer: Self-pay | Admitting: Internal Medicine

## 2018-08-18 DIAGNOSIS — Z1231 Encounter for screening mammogram for malignant neoplasm of breast: Secondary | ICD-10-CM

## 2018-08-30 ENCOUNTER — Encounter: Payer: Self-pay | Admitting: Obstetrics & Gynecology

## 2018-09-14 ENCOUNTER — Ambulatory Visit: Payer: BC Managed Care – PPO

## 2018-10-18 ENCOUNTER — Ambulatory Visit
Admission: RE | Admit: 2018-10-18 | Discharge: 2018-10-18 | Disposition: A | Discharge status: Home / Self Care | Payer: BC Managed Care – PPO | Source: Ambulatory Visit | Attending: Internal Medicine | Admitting: Internal Medicine

## 2018-10-18 ENCOUNTER — Other Ambulatory Visit: Payer: Self-pay

## 2018-10-18 DIAGNOSIS — Z1231 Encounter for screening mammogram for malignant neoplasm of breast: Secondary | ICD-10-CM | POA: Diagnosis not present

## 2018-12-25 DIAGNOSIS — Z20828 Contact with and (suspected) exposure to other viral communicable diseases: Secondary | ICD-10-CM | POA: Diagnosis not present

## 2019-07-20 ENCOUNTER — Encounter: Payer: Self-pay | Admitting: Obstetrics & Gynecology

## 2019-07-21 ENCOUNTER — Telehealth: Payer: Self-pay

## 2019-07-21 DIAGNOSIS — R1900 Intra-abdominal and pelvic swelling, mass and lump, unspecified site: Secondary | ICD-10-CM

## 2019-07-21 NOTE — Telephone Encounter (Signed)
[name]  Phone Number: [phone]  Hi.  When I saw Dr. Hyacinth Meeker last June (2020), we discussed the eventual need for surgical repair of a hernia. I wanted to wait d/t COVID, but lately it's been more painful/symptomatic at times. When I called the office today to schedule my annual check-up, I was told she couldn't see me until 01/29/21, which should be fine - but could I get a referral/name of the surgeon she'd recommend I see for the hernia repair in the meantime?   Thanks very much.  Melinda Andersen

## 2019-07-21 NOTE — Telephone Encounter (Signed)
Left message for pt to return call to triage RN. 

## 2019-07-21 NOTE — Telephone Encounter (Signed)
Patient is returning call.  °

## 2019-07-21 NOTE — Telephone Encounter (Signed)
AEX 08/2018 Next AEX scheduled 01/2020 Possible indirect hernia.   Spoke with pt. Pt states having worsening sx, intermittent pain of hernia for the last 3-4 months and is ready to see surgery specialist before having next AEX.  Pt requesting recommendations that Dr Hyacinth Meeker had mentioned at AEX. Advised will review and discus with Dr Hyacinth Meeker and return call to pt. Pt agreeable. Pt states ok to leave detailed message. Reviewed AEX notes, no provider listed.   Routing to Dr Hyacinth Meeker

## 2019-07-22 NOTE — Telephone Encounter (Signed)
Called [facility] and spoke with Cordelia Pen. A CT scan is recommended before referral placed.   CT abd/pelvis order pended for review. Referral to Dr Derrell Lolling pended for review.   Routing to Dr Hyacinth Meeker.

## 2019-07-22 NOTE — Telephone Encounter (Signed)
[facility] and see if [facility] need CT scan before referral.  If that is the case, we will need to order CT abd/pelvis prior to referral.  I would recommend Dr. Derrell Lolling.  He is very nice and does laparoscopic repairs of hernias.

## 2019-07-25 NOTE — Telephone Encounter (Signed)
Order and referral signed.  Thanks for working on this.

## 2019-07-26 ENCOUNTER — Telehealth: Payer: Self-pay | Admitting: Obstetrics & Gynecology

## 2019-07-26 NOTE — Telephone Encounter (Signed)
Call placed to [facility] to give appointment details for recommended CT scan. Appointment scheduled 08/11/19 for CT scan. I have left a message for the referral coordinator [place] to return my call.

## 2019-07-26 NOTE — Addendum Note (Signed)
Addended by: Isabell Jarvis on: 07/26/2019 11:19 AM   Modules accepted: Orders

## 2019-07-26 NOTE — Telephone Encounter (Signed)
Pt has scheduled CT scan:   Appt Date/Time/Location  08/11/2019  8:30 AM  at  [facility]    Melinda Andersen is aware of CT scan and will call [facility] to schedule consult afterwards with Dr. Derrell Lolling.   Routing to Dr Hyacinth Meeker for review and update  Cc: Rosa for Referral.  CcMayme Genta for PA for CT Scan

## 2019-08-05 ENCOUNTER — Encounter: Payer: Self-pay | Admitting: Obstetrics & Gynecology

## 2019-08-08 NOTE — Telephone Encounter (Signed)
Sent Rosa a message for follow up on referral to [facility].

## 2019-08-08 NOTE — Telephone Encounter (Signed)
Mychart messages:   Melinda, [facility] Clinical Pool  Phone Number: [phone]  I appreciate your quick response - makes sense. Will just keep my appointment for the CT this Thursday with [facility] (thankfully they offer payment plans) and proceed as [facility] recommends.   Hope you have a great week, Dr. Hyacinth Meeker.  Melinda Andersen       Visit Follow-Up Question  Melinda Foots, MD 8 hours ago (7:37 AM)   I appreciate your quick response - makes sense. Will just keep my appointment for the CT this Thursday with [facility] (thankfully they offer payment plans) and proceed as [facility] recommends.  Hope you have a great week, Dr. Hyacinth Meeker. Randa Spike, Elnita Maxwell, MD  Christin Bach L 9 hours ago (6:06 AM)   Melinda Andersen, We called [facility] prior to scheduling the CT to make sure this indeed is the process.  Because hernias are often over diagnoses on exam, [facility] requires a CT scan prior to scheduling the appt.  So, yes, it is needed.    You could check with [facility] on [address] because sometimes imaging costs less in other locations.  It shouldn't be that way but it is.  We don't tend to send patients to that location because it can be a hassle getting the reports but it is an option.    [facility] is the only general surgeon group in [place].  I could refer you somewhere else if you desire.  I do not know the general surgeons outside of [place] but I can do this if you desire.  I do not know if they will have the same requirement before the referral.  I can tell you that you will need the CT scan prior to surgery from any general surgeon.  They will want to know the size and exact location so it's going to be in your future at some point.    Let me know what you want the next step to be.  Thanks.  Melinda Hair, MD 3 days ago   I received a phone call from [facility] and  have scheduled a CT scan for next week, but learned today that my insurance will not cover this - it will be ~$750! I'm not sure which surgeon Dr. Hyacinth Meeker is referring me to, but is it truly necessary to have the scan done in advance of seeing the surgeon/scheduling the hernia repair?  Thanks very much! Melinda Andersen

## 2019-08-09 NOTE — Telephone Encounter (Signed)
I called again this morning and spoke with a rep. She said the referral must have the CT scan with it. I will just sent the referral with the CT scan results on 08/11/19.

## 2019-08-11 ENCOUNTER — Ambulatory Visit
Admission: RE | Admit: 2019-08-11 | Discharge: 2019-08-11 | Disposition: A | Discharge status: Home / Self Care | Payer: BC Managed Care – PPO | Source: Ambulatory Visit | Attending: Obstetrics & Gynecology | Admitting: Obstetrics & Gynecology

## 2019-08-11 ENCOUNTER — Other Ambulatory Visit: Payer: BC Managed Care – PPO

## 2019-08-11 DIAGNOSIS — K4090 Unilateral inguinal hernia, without obstruction or gangrene, not specified as recurrent: Secondary | ICD-10-CM | POA: Diagnosis not present

## 2019-08-11 DIAGNOSIS — R1900 Intra-abdominal and pelvic swelling, mass and lump, unspecified site: Secondary | ICD-10-CM

## 2019-08-11 DIAGNOSIS — K429 Umbilical hernia without obstruction or gangrene: Secondary | ICD-10-CM | POA: Diagnosis not present

## 2019-08-11 MED ORDER — IOPAMIDOL (ISOVUE-300) INJECTION 61%
100.0000 mL | Freq: Once | INTRAVENOUS | Status: AC | PRN
  Administered 2019-08-11: 100 mL via INTRAVENOUS

## 2019-08-12 ENCOUNTER — Telehealth: Payer: Self-pay

## 2019-08-12 NOTE — Telephone Encounter (Signed)
Spoke with pt. Pt given results from CT scan. Pt is agreeable to go to [facility] for consult. Pt states not having any pain at this time, but will keep consult for future surgery.  Routing to Dr Hyacinth Meeker for review and pt update.  Encounter closed.  Cc: referral notes sent by [facility] today 08/12/19.   Jerene Bears, MD  08/12/2019 9:42 AM EDT     Please let pt know she has a left inguinal hernia and small umbilical hernia. OK to refer to general surgery now. She also has a 4cm benign liver cyst. No treatment needed for this. She does have a stone in the upper portion of her left kidney. No treatment needed. Just good to know. Everything was normal. Out of imaging hold.

## 2019-08-12 NOTE — Telephone Encounter (Signed)
Referral notes sent to [facility] by [facility] today.  Pt aware of referral . CT scan results given on 08/12/19.See phone encounter dated 08/12/19.  Encounter closed.

## 2019-08-18 ENCOUNTER — Telehealth: Payer: Self-pay | Admitting: Obstetrics & Gynecology

## 2019-08-18 NOTE — Telephone Encounter (Signed)
Left voicemail regarding referral appointment. The information is listed below. Should the patient need to cancel or reschedule this appointment, Please advise them to call the office they've been referred to in order to reschedule  .[facility] [place] (Main Office) [phone] [address] [place], [place] [address] [phone]  Dr. Derrell Lolling 09/09/19 @ 11:20 am. Please arrive 15 minutes early and bring your insurance card and photo id and list of medications

## 2019-09-06 ENCOUNTER — Other Ambulatory Visit: Payer: Self-pay | Admitting: Obstetrics & Gynecology

## 2019-09-06 NOTE — Telephone Encounter (Signed)
Medication refill request: Junel  Last AEX:  08/05/18 Next AEX: 01/30/20 Last MMG (if hormonal medication request): 10/18/18 Bi-rads 1 neg  Refill authorized: #63 with 1 RF to get her to her AEX

## 2019-09-08 NOTE — Telephone Encounter (Signed)
Is there any way to move up this pt's AEX.  It was in June, 2020, and is scheduled for November, 2021.  I did complete OCP refill.

## 2019-09-08 NOTE — Telephone Encounter (Signed)
Spoke with patient. She said she has an appt tomorrow with her surgeon & will callback to schedule an earlier annual than November once she knows more information about her surgery.

## 2019-09-09 DIAGNOSIS — K4090 Unilateral inguinal hernia, without obstruction or gangrene, not specified as recurrent: Secondary | ICD-10-CM | POA: Diagnosis not present

## 2019-09-09 DIAGNOSIS — K429 Umbilical hernia without obstruction or gangrene: Secondary | ICD-10-CM | POA: Diagnosis not present

## 2019-10-05 ENCOUNTER — Other Ambulatory Visit: Payer: Self-pay | Admitting: Internal Medicine

## 2019-10-05 DIAGNOSIS — Z1231 Encounter for screening mammogram for malignant neoplasm of breast: Secondary | ICD-10-CM

## 2019-10-28 ENCOUNTER — Ambulatory Visit
Admission: RE | Admit: 2019-10-28 | Discharge: 2019-10-28 | Disposition: A | Discharge status: Home / Self Care | Payer: BC Managed Care – PPO | Source: Ambulatory Visit | Attending: Internal Medicine | Admitting: Internal Medicine

## 2019-10-28 ENCOUNTER — Other Ambulatory Visit: Payer: Self-pay

## 2019-10-28 DIAGNOSIS — Z1231 Encounter for screening mammogram for malignant neoplasm of breast: Secondary | ICD-10-CM

## 2019-11-13 ENCOUNTER — Other Ambulatory Visit: Payer: Self-pay | Admitting: Obstetrics & Gynecology

## 2019-11-16 ENCOUNTER — Encounter: Payer: Self-pay | Admitting: Obstetrics & Gynecology

## 2019-11-17 ENCOUNTER — Other Ambulatory Visit: Payer: Self-pay

## 2019-11-17 NOTE — Telephone Encounter (Signed)
Pt sent following mychart message:  Melinda, Mcdaniel Andersen Clinical Pool I was told my pharmacy will not refill the Junel Rx. I do have an appointment for my annual check-up with Dr. Hyacinth Meeker on November 29th, but my current supply of pills will only get me through the week prior. Would she please authorize 1 refill in the meantime?   Thanks very much.  Melinda Andersen

## 2019-11-17 NOTE — Telephone Encounter (Signed)
Med refill request: Junel 1/20 mg Last AEX: 08/2018 Next AEX:  01/30/20 Last MMG (if hormonal med) 10/31/19- Birads 1, Negative Refill authorized: Please Advise? # 28 tablets (1 Pack), 0RF   Rx pended if approved.   Routing to Dr Hyacinth Meeker

## 2019-11-21 MED ORDER — NORETHINDRONE ACET-ETHINYL EST 1-20 MG-MCG PO TABS: 1.0000 | ORAL_TABLET | Freq: Every day | ORAL | 2 refills | Status: DC

## 2019-12-09 DIAGNOSIS — Z0000 Encounter for general adult medical examination without abnormal findings: Secondary | ICD-10-CM | POA: Diagnosis not present

## 2019-12-15 DIAGNOSIS — Z0000 Encounter for general adult medical examination without abnormal findings: Secondary | ICD-10-CM | POA: Diagnosis not present

## 2019-12-15 DIAGNOSIS — Z1331 Encounter for screening for depression: Secondary | ICD-10-CM | POA: Diagnosis not present

## 2019-12-15 DIAGNOSIS — Z1389 Encounter for screening for other disorder: Secondary | ICD-10-CM | POA: Diagnosis not present

## 2019-12-15 DIAGNOSIS — Z13220 Encounter for screening for lipoid disorders: Secondary | ICD-10-CM | POA: Diagnosis not present

## 2019-12-15 DIAGNOSIS — Z23 Encounter for immunization: Secondary | ICD-10-CM | POA: Diagnosis not present

## 2020-01-10 DIAGNOSIS — L578 Other skin changes due to chronic exposure to nonionizing radiation: Secondary | ICD-10-CM | POA: Diagnosis not present

## 2020-01-10 DIAGNOSIS — L308 Other specified dermatitis: Secondary | ICD-10-CM | POA: Diagnosis not present

## 2020-01-30 ENCOUNTER — Ambulatory Visit: Payer: BC Managed Care – PPO | Admitting: Obstetrics & Gynecology

## 2020-02-05 ENCOUNTER — Other Ambulatory Visit: Payer: Self-pay | Admitting: Obstetrics & Gynecology

## 2020-02-06 NOTE — Telephone Encounter (Signed)
Medication refill request: JUNEL Last AEX:  08/05/18 Dr. Hyacinth Meeker Next AEX: none Last MMG (if hormonal medication request): 10/28/19 BIRADS 1 negative/density c Refill authorized: today, please advise

## 2020-02-23 ENCOUNTER — Other Ambulatory Visit: Payer: Self-pay | Admitting: Obstetrics and Gynecology

## 2020-04-19 ENCOUNTER — Other Ambulatory Visit (HOSPITAL_BASED_OUTPATIENT_CLINIC_OR_DEPARTMENT_OTHER): Payer: Self-pay | Admitting: *Deleted

## 2020-04-19 MED ORDER — NORETHINDRONE ACET-ETHINYL EST 1-20 MG-MCG PO TABS: 1.0000 | ORAL_TABLET | Freq: Every day | ORAL | 1 refills | Status: DC

## 2020-04-19 NOTE — Telephone Encounter (Signed)
Pt has annual exam 05/24/20 Sherrilyn Rist CMA

## 2020-04-25 ENCOUNTER — Encounter (HOSPITAL_BASED_OUTPATIENT_CLINIC_OR_DEPARTMENT_OTHER): Payer: Self-pay

## 2020-05-24 ENCOUNTER — Encounter (HOSPITAL_BASED_OUTPATIENT_CLINIC_OR_DEPARTMENT_OTHER): Payer: Self-pay | Admitting: Obstetrics & Gynecology

## 2020-05-24 ENCOUNTER — Other Ambulatory Visit (HOSPITAL_BASED_OUTPATIENT_CLINIC_OR_DEPARTMENT_OTHER)
Admission: RE | Admit: 2020-05-24 | Discharge: 2020-05-24 | Disposition: A | Discharge status: Home / Self Care | Payer: BC Managed Care – PPO | Source: Ambulatory Visit | Attending: Obstetrics & Gynecology | Admitting: Obstetrics & Gynecology

## 2020-05-24 ENCOUNTER — Other Ambulatory Visit: Payer: Self-pay

## 2020-05-24 ENCOUNTER — Other Ambulatory Visit (HOSPITAL_COMMUNITY)
Admission: RE | Admit: 2020-05-24 | Discharge: 2020-05-24 | Disposition: A | Discharge status: Home / Self Care | Payer: BC Managed Care – PPO | Source: Ambulatory Visit | Attending: Obstetrics & Gynecology | Admitting: Obstetrics & Gynecology

## 2020-05-24 ENCOUNTER — Ambulatory Visit (INDEPENDENT_AMBULATORY_CARE_PROVIDER_SITE_OTHER): Payer: BC Managed Care – PPO | Admitting: Obstetrics & Gynecology

## 2020-05-24 VITALS — BP 122/78 | HR 62 | Resp 16 | Ht 61.25 in | Wt 111.8 lb

## 2020-05-24 DIAGNOSIS — N926 Irregular menstruation, unspecified: Secondary | ICD-10-CM | POA: Insufficient documentation

## 2020-05-24 DIAGNOSIS — Z1151 Encounter for screening for human papillomavirus (HPV): Secondary | ICD-10-CM | POA: Diagnosis not present

## 2020-05-24 DIAGNOSIS — Z124 Encounter for screening for malignant neoplasm of cervix: Secondary | ICD-10-CM | POA: Insufficient documentation

## 2020-05-24 DIAGNOSIS — Z1159 Encounter for screening for other viral diseases: Secondary | ICD-10-CM | POA: Insufficient documentation

## 2020-05-24 DIAGNOSIS — Z01419 Encounter for gynecological examination (general) (routine) without abnormal findings: Secondary | ICD-10-CM

## 2020-05-24 DIAGNOSIS — R87612 Low grade squamous intraepithelial lesion on cytologic smear of cervix (LGSIL): Secondary | ICD-10-CM

## 2020-05-24 DIAGNOSIS — K4090 Unilateral inguinal hernia, without obstruction or gangrene, not specified as recurrent: Secondary | ICD-10-CM

## 2020-05-24 DIAGNOSIS — R87619 Unspecified abnormal cytological findings in specimens from cervix uteri: Secondary | ICD-10-CM | POA: Insufficient documentation

## 2020-05-24 DIAGNOSIS — Z1211 Encounter for screening for malignant neoplasm of colon: Secondary | ICD-10-CM

## 2020-05-24 DIAGNOSIS — Z114 Encounter for screening for human immunodeficiency virus [HIV]: Secondary | ICD-10-CM | POA: Diagnosis not present

## 2020-05-24 DIAGNOSIS — N393 Stress incontinence (female) (male): Secondary | ICD-10-CM | POA: Insufficient documentation

## 2020-05-24 DIAGNOSIS — I499 Cardiac arrhythmia, unspecified: Secondary | ICD-10-CM | POA: Diagnosis not present

## 2020-05-24 DIAGNOSIS — I7381 Erythromelalgia: Secondary | ICD-10-CM

## 2020-05-24 LAB — HIV ANTIBODY (ROUTINE TESTING W REFLEX): HIV Screen 4th Generation wRfx: NONREACTIVE

## 2020-05-24 LAB — HEPATITIS C ANTIBODY: HCV Ab: NONREACTIVE

## 2020-05-24 NOTE — Progress Notes (Signed)
50 y.o. G14P2002 [name] here for annual exam.  Skipped cycles early in the year.  Had a cycle this month.  Lasted 2 days and was light.  Doing well.    Patient's last menstrual period was 05/16/2020 (exact date).          Sexually active: Yes.    The current method of family planning is OCP (estrogen/progesterone).   Exercising: Yes.    walking, strength training Smoker:  no  Health Maintenance: Pap:  Will obtain today History of abnormal Pap:  Ascus pap 2019 MMG:  10/2019 Colonoscopy:  Referral placed today BMD:   Not indicated TDaP:  10/50/2021 done with Dr. Clelia Croft Pneumonia vaccine(s):  Not indicated Shingrix:   Not indicated Hep C testing: today Screening Labs: saw Dr. Clelia Croft 12/2019   reports that she has never smoked. She has never used smokeless tobacco. She reports current alcohol use of about 3.0 - 4.0 standard drinks of alcohol per week. She reports that she does not use drugs.  Past Medical History:  Diagnosis Date  . Abnormal Pap smear 05/07/2011   ascus   . Erythromelalgia (HCC) 04/2009   chronic red painful toes--Dr Christen Bame at [facility] (rheumatologist)  . Hx of colposcopy with cervical biopsy 2004; 08/12/16   2004 beningn.  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 CESAREAN SECTION  2003 & 2006  . COLONOSCOPY  1991   Fissure  . oral surgery    . WISDOM TOOTH EXTRACTION      Current Outpatient Medications  Medication Sig Dispense Refill  . cholecalciferol (VITAMIN D3) 25 MCG (1000 UT) tablet Take 1,000 Units by mouth daily.    Marland Kitchen loratadine (CLARITIN) 10 MG tablet daily.    . norethindrone-ethinyl estradiol (JUNEL 1/20) 1-20 MG-MCG tablet Take 1 tablet by mouth daily. 21 tablet 1  . fluticasone (FLONASE) 50 MCG/ACT nasal spray      No current facility-administered medications for this visit.    Family History  Problem Relation Age of Onset  . Alcohol abuse Father   .  Arthritis Maternal Grandmother   . Hyperlipidemia Maternal Grandmother   . Hypertension Maternal Grandmother   . Depression Maternal Grandmother   . Hyperlipidemia Maternal Grandfather   . Kidney disease Maternal Grandfather   . Heart failure Maternal Grandfather   . Cancer Other        breast  . Breast cancer Maternal Aunt   . Colon cancer Maternal Aunt     Review of Systems  All other systems reviewed and are negative.   Exam:   BP 122/78   Pulse 62   Resp 16   Ht 5' 1.25" (1.556 m)   Wt 111 lb 12.8 oz (50.7 kg)   LMP 05/16/2020 (Exact Date)   BMI 20.95 kg/m   Height: 5' 1.25" (155.6 cm)  General appearance: alert, cooperative and appears stated age Head: Normocephalic, without obvious abnormality, atraumatic Neck: no adenopathy, supple, symmetrical, trachea midline and thyroid normal to inspection and palpation Lungs: clear to auscultation bilaterally Breasts: normal appearance, no masses or tenderness Heart: irregular heart rate, no murmurs Abdomen: soft, non-tender; bowel sounds normal; no masses,  no organomegaly Extremities: extremities normal, atraumatic, no cyanosis or edema Skin: Skin color, texture, turgor normal. No rashes or lesions Lymph nodes: Cervical, supraclavicular, and axillary nodes normal. No abnormal inguinal nodes palpated Neurologic: Grossly normal  Pelvic: External genitalia:  no lesions  Urethra:  normal appearing urethra with no masses, tenderness or lesions              Bartholins and Skenes: normal                 Vagina: normal appearing vagina with normal color and discharge, no lesions              Cervix: no lesions              Pap taken: Yes.   Bimanual Exam:  Uterus:  normal size, contour, position, consistency, mobility, non-tender              Adnexa: normal adnexa and no mass, fullness, tenderness               Rectovaginal: Confirms               Anus:  normal sphincter tone, no lesions  Chaperone, Margret Chance,  CMA, was present for exam.  Assessment/Plan: 1. Well woman exam with routine gynecological exam - pap and HR HPV obtained today - MMG 10/2019 - colonoscopy referral placed today - BMD not needed yet - vaccines updated - labs typically done with Dr. Clelia Croft - HIV and Hep C obtained today  2. Irregular menstrual bleeding - Anti mullerian hormone; Future  3. Irregular heart rate - pt aware this is a new findings today.  She has been under a lot of stress the past few month.  Feel referral appropriate.   - Ambulatory referral to Cardiology  4. LGSIL on Pap smear of cervix - Cytology - PAP( )  5. ERYTHROMELALGIA - saw Rheumatologist in the past.  No autoimmune d/o was diagnosed.  6. Direct left inguinal hernia - saw Dr. Derrell Lolling last year.  Has not decided to proceed with surgery at this time

## 2020-05-25 DIAGNOSIS — K4090 Unilateral inguinal hernia, without obstruction or gangrene, not specified as recurrent: Secondary | ICD-10-CM | POA: Insufficient documentation

## 2020-05-25 LAB — CYTOLOGY - PAP
Comment: NEGATIVE
Diagnosis: NEGATIVE
High risk HPV: NEGATIVE

## 2020-05-30 DIAGNOSIS — I499 Cardiac arrhythmia, unspecified: Secondary | ICD-10-CM | POA: Diagnosis not present

## 2020-05-30 LAB — ANTI MULLERIAN HORMONE: ANTI-MULLERIAN HORMONE (AMH): 0.097 ng/mL

## 2020-06-01 ENCOUNTER — Other Ambulatory Visit (HOSPITAL_BASED_OUTPATIENT_CLINIC_OR_DEPARTMENT_OTHER): Payer: Self-pay | Admitting: Obstetrics & Gynecology

## 2020-06-01 MED ORDER — NORETHINDRONE 0.35 MG PO TABS: 1.0000 | ORAL_TABLET | Freq: Every day | ORAL | 4 refills | Status: DC

## 2020-06-05 ENCOUNTER — Other Ambulatory Visit (HOSPITAL_BASED_OUTPATIENT_CLINIC_OR_DEPARTMENT_OTHER): Payer: Self-pay | Admitting: Obstetrics & Gynecology

## 2020-06-05 ENCOUNTER — Encounter (HOSPITAL_BASED_OUTPATIENT_CLINIC_OR_DEPARTMENT_OTHER): Payer: Self-pay

## 2020-06-28 ENCOUNTER — Ambulatory Visit: Payer: BC Managed Care – PPO | Admitting: Cardiology

## 2020-10-01 DIAGNOSIS — I499 Cardiac arrhythmia, unspecified: Secondary | ICD-10-CM | POA: Diagnosis not present

## 2020-10-01 DIAGNOSIS — R001 Bradycardia, unspecified: Secondary | ICD-10-CM | POA: Diagnosis not present

## 2020-10-01 DIAGNOSIS — I493 Ventricular premature depolarization: Secondary | ICD-10-CM | POA: Diagnosis not present

## 2020-10-03 NOTE — Progress Notes (Signed)
Date:  10/04/2020   ID:  Melinda Andersen, DOB 02/22/1971, MRN [id number]  PCP:  Cleatis Polka., MD  Cardiologist:  Tessa Lerner, DO, [facility]  (established care 10/04/2020)  REASON FOR CONSULT: Bradycardia   REQUESTING PHYSICIAN:  Cleatis Polka., MD [address] [place],  [place] [address]  Chief Complaint  Patient presents with   Bradycardia   New Patient (Initial Visit)    HPI  Melinda Andersen is a 50 y.o. female who presents to the office with a chief complaint of " evaluation for bradycardia."  No past cardiac history.  She is referred to the office at the request of Cleatis Polka., MD for evaluation of bradycardia.  Very pleasant 50 year old female who presents to the office for evaluation of bradycardia after recent incident at the dentist office earlier this week.  She was scheduled for an oral teeth extraction and received local anesthesia along with nitric oxide.  She was being monitored and on the 3-lead EKG was noted to have a variable heart rate ranging from 26-86 bpm.  Due to bradycardia the procedure was aborted and she later saw her PCP for further evaluation and management.  No rhythm strips or EKG available for review.  Patient states that she was pretty much asymptomatic.  She had labs with her PCP which were also within normal limits and now referred to cardiology for further evaluation and management.  She states that on average her heart rate is around 65 bpm.  She denies lightheadedness, dizziness, tired, fatigue, near-syncope or syncope.  Her blood pressure today is elevated and she does not carry a history of hypertension.  No family history of premature coronary artery disease or sudden cardiac death.  Her father did have a PCI at the age of 92 but also has other chronic comorbid conditions.  FUNCTIONAL STATUS: Exercises at least 3 days a week with either walking or strength training.  ALLERGIES: Allergies  Allergen Reactions   Penicillins      REACTION: Rash    MEDICATION LIST PRIOR TO VISIT: Current Meds  Medication Sig   acetaminophen (TYLENOL) 500 MG tablet Take 500 mg by mouth every 6 (six) hours as needed.   cholecalciferol (VITAMIN D3) 25 MCG (1000 UT) tablet Take 1,000 Units by mouth daily.   clindamycin (CLEOCIN) 150 MG capsule Take 150 mg by mouth 3 (three) times daily.   naproxen (NAPROSYN) 250 MG tablet Take by mouth as needed.   norethindrone (MICRONOR) 0.35 MG tablet Take 1 tablet (0.35 mg total) by mouth daily.     PAST MEDICAL HISTORY: Past Medical History:  Diagnosis Date   Abnormal Pap smear 05/07/2011   ascus    Erythromelalgia (HCC) 04/2009   chronic red painful toes--Dr Christen [facility]ame at [facility] (rheumatologist)   Hx of colposcopy with cervical biopsy 2004; 08/12/16   2004 beningn.  2018 path pending (impression by GYN exam was CIN I)    PAST SURGICAL HISTORY: Past Surgical History:  Procedure Laterality Date   CESAREAN SECTION  2003 & 2006   COLONOSCOPY  1991   Fissure   oral surgery     WISDOM TOOTH EXTRACTION      FAMILY HISTORY: The patient family history includes Alcohol abuse in her father; Arthritis in her maternal grandmother; Breast cancer in her maternal aunt; Cancer in an other family member; Colon cancer in her maternal aunt; Depression in her maternal grandmother; Heart failure in her maternal grandfather; Hyperlipidemia in her maternal grandfather  and maternal grandmother; Hypertension in her maternal grandmother; Kidney disease in her maternal grandfather.  SOCIAL HISTORY:  The patient  reports that she has never smoked. She has never used smokeless tobacco. She reports current alcohol use of about 1.0 - 2.0 standard drink of alcohol per week. She reports that she does not use drugs.  REVIEW OF SYSTEMS: Review of Systems  Constitutional: Negative for chills and fever.  HENT:  Negative for hoarse voice and nosebleeds.   Eyes:  Negative for discharge, double vision and pain.  Cardiovascular:   Negative for chest pain, claudication, dyspnea on exertion, leg swelling, near-syncope, orthopnea, palpitations, paroxysmal nocturnal dyspnea and syncope.  Respiratory:  Negative for hemoptysis and shortness of breath.   Musculoskeletal:  Negative for muscle cramps and myalgias.  Gastrointestinal:  Negative for abdominal pain, constipation, diarrhea, hematemesis, hematochezia, melena, nausea and vomiting.  Neurological:  Negative for dizziness and light-headedness.   PHYSICAL EXAM: Vitals with BMI 10/04/2020 10/04/2020 05/24/2020  Height - 5\' 1"  5' 1.25"  Weight - 113 lbs 13 oz 111 lbs 13 oz  BMI - 21.51 20.95  Systolic 147 142  Diastolic 93 95 78  Pulse 73 84 62    CONSTITUTIONAL: Well-developed and well-nourished. No acute distress.  SKIN: Skin is warm and dry. No rash noted. No cyanosis. No pallor. No jaundice HEAD: Normocephalic and atraumatic.  EYES: No scleral icterus MOUTH/THROAT: Moist oral membranes.  NECK: No JVD present. No thyromegaly noted. No carotid bruits  LYMPHATIC: No visible cervical adenopathy.  CHEST Normal respiratory effort. No intercostal retractions  LUNGS: Clear to auscultation bilaterally.  No stridor. No wheezes. No rales.  CARDIOVASCULAR: Regular rate and rhythm, positive S1-S2, no murmurs rubs or gallops appreciated ABDOMINAL: Soft, nontender, nondistended, positive bowel sounds all 4 quadrants, no apparent ascites.  EXTREMITIES: No peripheral edema  HEMATOLOGIC: No significant bruising NEUROLOGIC: Oriented to person, place, and time. Nonfocal. Normal muscle tone.  PSYCHIATRIC: Normal mood and affect. Normal behavior. Cooperative  CARDIAC DATABASE: EKG: 10/04/2020: Normal sinus rhythm, 70 bpm, normal axis, left atrial enlargement, without underlying ischemia or injury pattern.   Echocardiogram: No results found for this or any previous visit from the past 1095 days.   Stress Testing: No results found for this or any previous visit from the past 1095  days.  Heart Catheterization: None  LABORATORY DATA: External Labs: Collected: 10/01/2020 provided by PCP. Sodium 137, potassium 4.4, chloride 106, bicarb 25, BUN 12, creatinine 0.7. AST 15, ALT 9, alkaline phosphatase 57. Hemoglobin 14.7 g/dL, hematocrit 12/01/2020. Total cholesterol 180, triglycerides 64, HDL 66, LDL 101, non-HDL 114. TSH 1.14.  IMPRESSION:    ICD-10-CM   1. Bradycardia  R00.1 EKG 12-Lead    PCV ECHOCARDIOGRAM COMPLETE    PCV CARDIAC STRESS TEST    LONG TERM MONITOR (3-14 DAYS)       RECOMMENDATIONS: Melinda Andersen is a 50 y.o. female presents today for evaluation of bradycardia.  Bradycardia: Patient states that in the past she has been noted to have " skipped beats" as per other medical providers but never had it worked up until recently when she went for dental procedure and after getting local anesthesia and nitrous oxide she was noted to have bradycardia.  The procedure was aborted and now referred to primary care and cardiology for further evaluation and management.  EKG in the office is nonischemic. As part of this consultation reviewed records and labs provided by PCP via [facility]. No identifiable reversible cause. 7-day extended Holter monitor  to evaluate for underlying dysrhythmias. Echocardiogram will be ordered to evaluate for structural heart disease and left ventricular systolic function. Plan exercise treadmill stress test to evaluate for chronotropic competence and functional status.  Patient's blood pressures are elevated at today's office visit.  She does not carry a diagnosis of hypertension.  I have asked her to keep a log of her blood pressures an outpatient basis and to review it with her PCP to see if additional interventions are warranted.  Also recommended importance of low-salt diet  I like to see her back in the office in 4 weeks to review the test results and reevaluate her symptoms.  If the work-up is unremarkable and she remains  asymptomatic would recommend follow-up on an as-needed basis.  Patient is agreeable to the plan of care, verbalizes understanding and provides verbal feedback  FINAL MEDICATION LIST END OF ENCOUNTER: No orders of the defined types were placed in this encounter.   Medications Discontinued During This Encounter  Medication Reason   fluticasone (FLONASE) 50 MCG/ACT nasal spray Error   loratadine (CLARITIN) 10 MG tablet Error     Current Outpatient Medications:    acetaminophen (TYLENOL) 500 MG tablet, Take 500 mg by mouth every 6 (six) hours as needed., Disp: , Rfl:    cholecalciferol (VITAMIN D3) 25 MCG (1000 UT) tablet, Take 1,000 Units by mouth daily., Disp: , Rfl:    clindamycin (CLEOCIN) 150 MG capsule, Take 150 mg by mouth 3 (three) times daily., Disp: , Rfl:    naproxen (NAPROSYN) 250 MG tablet, Take by mouth as needed., Disp: , Rfl:    norethindrone (MICRONOR) 0.35 MG tablet, Take 1 tablet (0.35 mg total) by mouth daily., Disp: 84 tablet, Rfl: 4  Orders Placed This Encounter  Procedures   PCV CARDIAC STRESS TEST   LONG TERM MONITOR (3-14 DAYS)   EKG 12-Lead   PCV ECHOCARDIOGRAM COMPLETE    There are no Patient Instructions on file for this visit.   --Continue cardiac medications as reconciled in final medication list. --Return in about 4 weeks (around 11/01/2020) for Follow up Bradycardia , Review test results. Or sooner if needed. --Continue follow-up with your primary care physician regarding the management of your other chronic comorbid conditions.  Patient's questions and concerns were addressed to her satisfaction. She voices understanding of the instructions provided during this encounter.   This note was created using a voice recognition software as a result there may be grammatical errors inadvertently enclosed that do not reflect the nature of this encounter. Every attempt is made to correct such errors.  Tessa Lerner, [place], [facility]  Pager: [phone] Office:  [phone]

## 2020-10-04 ENCOUNTER — Inpatient Hospital Stay: Payer: BC Managed Care – PPO

## 2020-10-04 ENCOUNTER — Other Ambulatory Visit: Payer: Self-pay

## 2020-10-04 ENCOUNTER — Encounter: Payer: Self-pay | Admitting: Cardiology

## 2020-10-04 ENCOUNTER — Ambulatory Visit: Payer: BC Managed Care – PPO | Admitting: Cardiology

## 2020-10-04 VITALS — BP 147/93 | HR 73 | Resp 16 | Ht 61.0 in | Wt 113.8 lb

## 2020-10-04 DIAGNOSIS — R001 Bradycardia, unspecified: Secondary | ICD-10-CM

## 2020-10-05 ENCOUNTER — Encounter (HOSPITAL_BASED_OUTPATIENT_CLINIC_OR_DEPARTMENT_OTHER): Payer: Self-pay

## 2020-10-10 ENCOUNTER — Encounter (HOSPITAL_BASED_OUTPATIENT_CLINIC_OR_DEPARTMENT_OTHER): Payer: Self-pay | Admitting: Cardiology

## 2020-10-10 ENCOUNTER — Ambulatory Visit (HOSPITAL_BASED_OUTPATIENT_CLINIC_OR_DEPARTMENT_OTHER): Payer: BC Managed Care – PPO | Admitting: Cardiology

## 2020-10-10 ENCOUNTER — Other Ambulatory Visit: Payer: Self-pay

## 2020-10-10 VITALS — BP 138/88 | HR 61 | Ht 61.0 in | Wt 111.6 lb

## 2020-10-10 DIAGNOSIS — R001 Bradycardia, unspecified: Secondary | ICD-10-CM | POA: Diagnosis not present

## 2020-10-10 DIAGNOSIS — Z01810 Encounter for preprocedural cardiovascular examination: Secondary | ICD-10-CM

## 2020-10-10 DIAGNOSIS — Z7189 Other specified counseling: Secondary | ICD-10-CM

## 2020-10-10 NOTE — Patient Instructions (Signed)

## 2020-10-10 NOTE — Progress Notes (Signed)
Cardiology Office Note:    Date:  10/10/2020   ID:  Melinda Andersen, DOB 1970/10/14, MRN [id number]  PCP:  Cleatis Polka., MD  Cardiologist:  Jodelle Red, MD  Referring MD: Olevia Perches, NP   CC: new patient consultation for bradycardia during procedure, preoperative cardiovascular evaluation   History of Present Illness:    Melinda Andersen is a 50 y.o. female with no pertinent cardiovascular hx who is seen as a new consult at the request of Olevia Perches, NP for the evaluation and management of erratic bradycardia during a tooth extraction. She needs cardiology evaluation before tooth extraction can be re-attempted.  Cardiovascular risk factors: Prior clinical ASCVD: None Comorbid conditions, including hypertension, hyperlipidemia, diabetes, chronic kidney disease: none Metabolic syndrome/Obesity: BMI 21 Chronic inflammatory conditions: none Tobacco use history: never Family history: Father had a stent placed, is a smoker. Mother has hypertension. Grandparents had CAD but lived to 7 yo. Prior cardiac testing and/or incidental findings on other testing (ie coronary calcium): none Exercise level: 15 minutes at least 3 times a week, usually walking or low-impact Youtube workouts.  She was originally scheduled to visit her dentist 7/14 for a root canal, but was rescheduled for a tooth extraction. She was nervous and given 3 injections of local anesthesia as well as nitrous oxide. Halfway through, the procedure was discontinued due to her heart rate showing 26-86 from pulse ox. She did not feel near-syncopal at all. Mostly, she felt overwhelmed, claustrophobic, and some tingling in her fingers. She saw her PCP office same day for an urgent visit, and her heart rate was 67 bpm. I cannot see actual ECG images, but reported as sinus with sinus arrhythmia, 67 bpm, PVCs. She reports that months ago she was told she might have a skipped beat on occasion.  Her tooth was not pulled out  completely, and her periodontist is requesting cardiology approval for the extraction. She was prescribed an anti-biotic and tramadol until this can be performed.  She has a history of elevated blood pressure with medical visits and when in pain. Has never required medications for hypertension in the past.   She denies any palpitations, chest pain, or shortness of breath. No lightheadedness, headaches, syncope, orthopnea, or PND. Also has no lower extremity edema or exertional symptoms.   Past Medical History:  Diagnosis Date   Abnormal Pap smear 05/07/2011   ascus    Erythromelalgia (HCC) 04/2009   chronic red painful toes--Dr Christen Bame at [facility] (rheumatologist)   Hx of colposcopy with cervical biopsy 2004; 08/12/16   2004 beningn.  2018 path pending (impression by GYN exam was CIN I)    Past Surgical History:  Procedure Laterality Date   CESAREAN SECTION  2003 & 2006   COLONOSCOPY  1991   Fissure   oral surgery     WISDOM TOOTH EXTRACTION      Current Medications: Current Outpatient Medications on File Prior to Visit  Medication Sig   acetaminophen (TYLENOL) 500 MG tablet Take 500 mg by mouth every 6 (six) hours as needed.   cholecalciferol (VITAMIN D3) 25 MCG (1000 UT) tablet Take 1,000 Units by mouth daily.   loratadine (CLARITIN) 10 MG tablet Take 10 mg by mouth daily.   naproxen (NAPROSYN) 250 MG tablet Take by mouth as needed.   norethindrone (MICRONOR) 0.35 MG tablet Take 1 tablet (0.35 mg total) by mouth daily.   No current facility-administered medications on file prior to visit.  Allergies:   Penicillins   Social History   Tobacco Use   Smoking status: Never   Smokeless tobacco: Never  Vaping Use   Vaping Use: Never used  Substance Use Topics   Alcohol use: Yes    Alcohol/week: 1.0 - 2.0 standard drink    Types: 1 - 2 Glasses of wine per week    Comment: occ   Drug use: No    Family History: family history includes Alcohol abuse in her father; Arthritis  in her maternal grandmother; Breast cancer in her maternal aunt; Cancer in an other family member; Colon cancer in her maternal aunt; Depression in her maternal grandmother; Heart failure in her maternal grandfather; Hyperlipidemia in her maternal grandfather and maternal grandmother; Hypertension in her maternal grandmother; Kidney disease in her maternal grandfather.  ROS:   Please see the history of present illness.  Additional pertinent ROS: Constitutional: Negative for chills, fever, night sweats, unintentional weight loss  HENT: Negative for ear pain and hearing loss.   Eyes: Negative for loss of vision and eye pain.  Respiratory: Negative for cough, sputum, wheezing.   Cardiovascular: See HPI. Gastrointestinal: Negative for abdominal pain, melena, and hematochezia.  Genitourinary: Negative for dysuria and hematuria.  Musculoskeletal: Negative for falls and myalgias.  Skin: Negative for itching and rash.  Neurological: Negative for focal weakness, focal sensory changes and loss of consciousness.  Endo/Heme/Allergies: Does not bruise/bleed easily.     EKGs/Labs/Other Studies Reviewed:    The following studies were reviewed today: No prior CV studies available.  EKG:  EKG is personally reviewed.   10/10/2020: NSR. Rate 61 bpm.   Recent Labs: No results found for requested labs within last 8760 hours.  Recent Lipid Panel No results found for: CHOL, TRIG, HDL, CHOLHDL, VLDL, LDLCALC, LDLDIRECT  Physical Exam:    VS:  BP 138/88   Pulse 61   Ht 5\' 1"  (1.549 m)   Wt 111 lb 9.6 oz (50.6 kg)   BMI 21.09 kg/m     Wt Readings from Last 3 Encounters:  10/10/20 111 lb 9.6 oz (50.6 kg)  10/04/20 113 lb 12.8 oz (51.6 kg)  05/24/20 111 lb 12.8 oz (50.7 kg)    GEN: Well nourished, well developed in no acute distress HEENT: Normal, moist mucous membranes NECK: No JVD CARDIAC: regular rhythm, normal S1 and S2, no rubs or gallops. No murmur. VASCULAR: Radial and DP pulses 2+  bilaterally. No carotid bruits RESPIRATORY:  Clear to auscultation without rales, wheezing or rhonchi  ABDOMEN: Soft, non-tender, non-distended MUSCULOSKELETAL:  Ambulates independently SKIN: Warm and dry, no edema NEUROLOGIC:  Alert and oriented x 3. No focal neuro deficits noted. PSYCHIATRIC:  Normal affect    ASSESSMENT:    1. Bradycardia   2. Cardiac risk counseling   3. Counseling on health promotion and disease prevention   4. Preop cardiovascular exam    PLAN:    Bradycardia during dental procedure: -unclear etiology of this. While certain inhaled anesthetics such as halothane have known risk of bradycardia, there is little data that nitrous oxide alone would cause this -we do not know the actual rhythm during the event. She reports that the heart rate was reported off of the pulse ox. If this is the case, the heart rate may be inaccurate, especially if PVCs were present -she was asymptomatic and hemodynamically stable -she also has a good amount of vagal tone at baseline. If she was stressed/anxious, this may have been partially vagally mediated. -she has no  ectopy on exam or by ECG today  We discussed further evaluation at length. As she is overall health, no abnormalities on exam or ECG, and asymptomatic, I do not think that an echocardiogram or stress test are needed at this time.   We did discuss an event monitor. She has been told that she has skipped beats/PVCs in the past, though none today. Again, she was asymptomatic and hemodynamically stable at the time that her heart rate was noted to be slow. She may have been in ventricular bigeminy or similar, but my suspicion overall that she has either a high burden of PVCs or significant dysrhythmia is low.  She is active at baseline, RCRI=0, and the main issue is needing to have her tooth extracted. She has had multiple dental procedures in the past with the periodontist who will be performing the re-attempt at extraction. I  would recommend using the anesthesia that she has successfully tolerated before. She does not have a clear contraindication for any anesthesia, though she reports she did not like the nitrous.   Based on AHA guidelines, no further testing required. She may proceed to extraction at acceptable risk.  If heart rate will be monitored by pulse ox, recommend correlation with ECG/monitor if pulse reads low.  We will follow up in 3 mos to see if her blood pressure improves once her dental issues are resolved.  Cardiac risk counseling and prevention recommendations: -recommend heart healthy/Mediterranean diet, with whole grains, fruits, vegetable, fish, lean meats, nuts, and olive oil. Limit salt. -recommend moderate walking, 3-5 times/week for 30-50 minutes each session. Aim for at least 150 minutes.week. Goal should be pace of 3 miles/hours, or walking 1.5 miles in 30 minutes -recommend avoidance of tobacco products. Avoid excess alcohol. -ASCVD risk score: The ASCVD Risk score Denman George DC Jr., et al., 2013) failed to calculate for the following reasons:   Cannot find a previous HDL lab   Cannot find a previous total cholesterol lab    Plan for follow up: 3 months or sooner as needed.  Jodelle Red, MD, PhD, [facility]  [facility]    Medication Adjustments/Labs and Tests Ordered: Current medicines are reviewed at length with the patient today.  Concerns regarding medicines are outlined above.   Orders Placed This Encounter  Procedures   EKG 12-Lead    No orders of the defined types were placed in this encounter.  Patient Instructions  Medication Instructions:  Your Physician recommend you continue on your current medication as directed.    *If you need a refill on your cardiac medications before your next appointment, please call your pharmacy*   Lab Work: None ordered today   Testing/Procedures: None ordered today   Follow-Up: At [facility], you and your  health needs are our priority.  As part of our continuing mission to provide you with exceptional heart care, we have created designated Provider Care Teams.  These Care Teams include your primary Cardiologist (physician) and Advanced Practice Providers (APPs -  Physician Assistants and Nurse Practitioners) who all work together to provide you with the care you need, when you need it.  We recommend signing up for the patient portal called "MyChart".  Sign up information is provided on this After Visit Summary.  MyChart is used to connect with patients for Virtual Visits (Telemedicine).  Patients are able to view lab/test results, encounter notes, upcoming appointments, etc.  Non-urgent messages can be sent to your provider as well.   To learn more  about what you can do with MyChart, go to [email or web address].    Your next appointment:   3 month(s)  The format for your next appointment:   In Person  Provider:   Jodelle Red, MD     [facility],acting as a scribe for Jodelle Red, MD.,have documented all relevant documentation on the behalf of Jodelle Red, MD,as directed by  Jodelle Red, MD while in the presence of Jodelle Red, MD.  I, Jodelle Red, MD, have reviewed all documentation for this visit. The documentation on 10/10/20 for the exam, diagnosis, procedures, and orders are all accurate and complete.   Signed, Jodelle Red, MD PhD 10/10/2020 6:49 PM    [facility]

## 2020-10-11 ENCOUNTER — Encounter (HOSPITAL_BASED_OUTPATIENT_CLINIC_OR_DEPARTMENT_OTHER): Payer: Self-pay

## 2020-10-11 ENCOUNTER — Ambulatory Visit: Payer: BC Managed Care – PPO | Admitting: Interventional Cardiology

## 2020-10-16 ENCOUNTER — Encounter: Payer: Self-pay | Admitting: Gastroenterology

## 2020-10-18 ENCOUNTER — Ambulatory Visit: Payer: BC Managed Care – PPO | Admitting: Cardiology

## 2020-11-09 ENCOUNTER — Ambulatory Visit (AMBULATORY_SURGERY_CENTER): Payer: BC Managed Care – PPO

## 2020-11-09 ENCOUNTER — Encounter: Payer: Self-pay | Admitting: Gastroenterology

## 2020-11-09 ENCOUNTER — Other Ambulatory Visit: Payer: Self-pay

## 2020-11-09 VITALS — Ht 61.0 in | Wt 110.0 lb

## 2020-11-09 DIAGNOSIS — Z1211 Encounter for screening for malignant neoplasm of colon: Secondary | ICD-10-CM

## 2020-11-09 MED ORDER — PLENVU 140 G PO SOLR: 1.0000 | ORAL | 0 refills | Status: DC

## 2020-11-09 NOTE — Progress Notes (Signed)
Pre visit completed via phone call; Patient verified name, DOB, and address; No egg or soy allergy known to patient  No issues known to pt with past sedation with any surgeries or procedures Patient denies ever being told they had issues or difficulty with intubation  No FH of Malignant Hyperthermia Pt is not on diet pills Pt is not on  home 02  Pt is not on blood thinners  Pt denies issues with constipation at this time;  No A fib or A flutter  EMMI video via MyChart  COVID 19 guidelines implemented in PV today with Pt and RN  Pt is fully vaccinated for Covid x 2; Coupon given to pt in PV today, Code to Pharmacy and NO PA's for preps discussed with pt in PV today;  Discussed with pt there will be an out-of-pocket cost for prep and that varies from $0 to 70 +  dollars  Due to the COVID-19 pandemic we are asking patients to follow certain guidelines.  Pt aware of COVID protocols and [facility] guidelines   

## 2020-11-10 ENCOUNTER — Encounter (HOSPITAL_BASED_OUTPATIENT_CLINIC_OR_DEPARTMENT_OTHER): Payer: Self-pay

## 2020-11-30 ENCOUNTER — Ambulatory Visit: Payer: BC Managed Care – PPO | Admitting: Gastroenterology

## 2020-11-30 ENCOUNTER — Other Ambulatory Visit: Payer: Self-pay

## 2020-11-30 ENCOUNTER — Encounter: Payer: Self-pay | Admitting: Gastroenterology

## 2020-11-30 ENCOUNTER — Encounter (HOSPITAL_BASED_OUTPATIENT_CLINIC_OR_DEPARTMENT_OTHER): Payer: Self-pay

## 2020-11-30 VITALS — BP 133/83 | HR 75 | Temp 99.1°F | Resp 14 | Ht 61.0 in | Wt 110.0 lb

## 2020-11-30 DIAGNOSIS — Z1211 Encounter for screening for malignant neoplasm of colon: Secondary | ICD-10-CM | POA: Diagnosis not present

## 2020-11-30 MED ORDER — SODIUM CHLORIDE 0.9 % IV SOLN: 500.0000 mL | Freq: Once | INTRAVENOUS | Status: DC

## 2020-11-30 NOTE — Op Note (Signed)
[facility] Patient Name: Melinda Andersen Procedure Date: 11/30/2020 2:43 PM MRN: [id number] Endoscopist: Tressia Danas MD, MD Age: 50 Referring MD:  Date of Birth: 1970/08/24 Gender: Female Account #: [id number] Procedure:                Colonoscopy Indications:              Screening for colorectal malignant neoplasm, This                            is the patient's first colonoscopy                           No known family history of colon cancer or polyps                           No baseline GI symptoms Medicines:                Monitored Anesthesia Care Procedure:                Pre-Anesthesia Assessment:                           - Prior to the procedure, a History and Physical                            was performed, and patient medications and                            allergies were reviewed. The patient's tolerance of                            previous anesthesia was also reviewed. The risks                            and benefits of the procedure and the sedation                            options and risks were discussed with the patient.                            All questions were answered, and informed consent                            was obtained. Prior Anticoagulants: The patient has                            taken no previous anticoagulant or antiplatelet                            agents. ASA Grade Assessment: I - A normal, healthy                            patient. After reviewing the risks and benefits,  the patient was deemed in satisfactory condition to                            undergo the procedure.                           After obtaining informed consent, the colonoscope                            was passed under direct vision. Throughout the                            procedure, the patient's blood pressure, pulse, and                            oxygen saturations were monitored continuously. The                             PCF-HQ190L Colonoscope was introduced through the                            anus and advanced to the 3 cm into the ileum. A                            second forward view of the right colon was                            performed. The colonoscopy was performed with                            moderate difficulty due to significant looping and                            a tortuous colon. Successful completion of the                            procedure was aided by applying abdominal pressure.                            The patient tolerated the procedure well. The                            quality of the bowel preparation was excellent. The                            terminal ileum, ileocecal valve, appendiceal                            orifice, and rectum were photographed. Scope In: 3:16:24 PM Scope Out: 3:30:09 PM Scope Withdrawal Time: 0 hours 9 minutes 45 seconds  Total Procedure Duration: 0 hours 13 minutes 45 seconds  Findings:                 Non-bleeding external and internal hemorrhoids were  found.                           The exam was otherwise without abnormality on                            direct and retroflexion views. Complications:            No immediate complications. Estimated Blood Loss:     Estimated blood loss: none. Impression:               - Non-bleeding external and internal hemorrhoids.                           - The examination was otherwise normal on direct                            and retroflexion views.                           - No specimens collected. Recommendation:           - Patient has a contact number available for                            emergencies. The signs and symptoms of potential                            delayed complications were discussed with the                            patient. Return to normal activities tomorrow.                            Written discharge instructions were  provided to the                            patient.                           - Resume previous diet.                           - Continue present medications.                           - Repeat colonoscopy in 10 years for surveillance.                           - Emerging evidence supports eating a diet of                            fruits, vegetables, grains, calcium, and yogurt                            while reducing red meat and alcohol may reduce the  risk of colon cancer.                           - Thank you for allowing me to be involved in your                            colon cancer prevention. Tressia Danas MD, MD 11/30/2020 3:37:00 PM This report has been signed electronically.

## 2020-11-30 NOTE — Progress Notes (Signed)
Pt's states no medical or surgical changes since previsit or office visit.  Vitals by DT 

## 2020-11-30 NOTE — Patient Instructions (Signed)
Thank you for allowing [place] to care for you today! Resume previous diet and medications/supplements today.  Return to normal daily activities tomorrow. Recommend next screening colonoscopy in 10 years.    YOU HAD AN ENDOSCOPIC PROCEDURE TODAY AT THE [facility]:   Refer to the procedure report that was given to you for any specific questions about what was found during the examination.  If the procedure report does not answer your questions, please call your gastroenterologist to clarify.  If you requested that your care partner not be given the details of your procedure findings, then the procedure report has been included in a sealed envelope for you to review at your convenience later.  YOU SHOULD EXPECT: Some feelings of bloating in the abdomen. Passage of more gas than usual.  Walking can help get rid of the air that was put into your GI tract during the procedure and reduce the bloating. If you had a lower endoscopy (such as a colonoscopy or flexible sigmoidoscopy) you may notice spotting of blood in your stool or on the toilet paper. If you underwent a bowel prep for your procedure, you may not have a normal bowel movement for a few days.  Please Note:  You might notice some irritation and congestion in your nose or some drainage.  This is from the oxygen used during your procedure.  There is no need for concern and it should clear up in a day or so.  SYMPTOMS TO REPORT IMMEDIATELY:  Following lower endoscopy (colonoscopy or flexible sigmoidoscopy):  Excessive amounts of blood in the stool  Significant tenderness or worsening of abdominal pains  Swelling of the abdomen that is new, acute  Fever of 100F or higher     For urgent or emergent issues, a gastroenterologist can be reached at any hour by calling [phone]. Do not use MyChart messaging for urgent concerns.    DIET:  We do recommend a small meal at first, but then you may proceed to your regular diet.  Drink  plenty of fluids but you should avoid alcoholic beverages for 24 hours.  ACTIVITY:  You should plan to take it easy for the rest of today and you should NOT DRIVE or use heavy machinery until tomorrow (because of the sedation medicines used during the test).    FOLLOW UP: Our staff will call the number listed on your records 48-72 hours following your procedure to check on you and address any questions or concerns that you may have regarding the information given to you following your procedure. If we do not reach you, we will leave a message.  We will attempt to reach you two times.  During this call, we will ask if you have developed any symptoms of COVID 19. If you develop any symptoms (ie: fever, flu-like symptoms, shortness of breath, cough etc.) before then, please call [phone].  If you test positive for Covid 19 in the 2 weeks post procedure, please call and report this information to [place].    If any biopsies were taken you will be contacted by phone or by letter within the next 1-3 weeks.  Please call us at [phone] if you have not heard about the biopsies in 3 weeks.    SIGNATURES/CONFIDENTIALITY: You and/or your care partner have signed paperwork which will be entered into your electronic medical record.  These signatures attest to the fact that that the information above on your After Visit Summary has been reviewed and is understood.  Full responsibility of the confidentiality of this discharge information lies with you and/or your care-partner.  

## 2020-11-30 NOTE — Progress Notes (Signed)
Vss nad transferred to pacu 

## 2020-11-30 NOTE — Progress Notes (Signed)
Referring Provider: Ginger Organ., MD Primary Care Physician:  Ginger Organ., MD  Reason for Procedure:  Colon cancer screening   IMPRESSION:  Need for colon cancer screening Appropriate candidate for monitored anesthesia care  PLAN: Colonoscopy in the [place] today   HPI: Melinda Andersen is a 50 y.o. female presents for screening colonoscopy.  No prior colonoscopy or colon cancer screening.  No baseline GI symptoms.   No known family history of colon cancer or polyps. No family history of uterine/endometrial cancer, pancreatic cancer or gastric/stomach cancer.   Past Medical History:  Diagnosis Date   Abnormal Pap smear 05/07/2011   ascus    Erythromelalgia ([place]) 04/2009   chronic red painful toes--Dr Geraldo Docker at [facility] (rheumatologist)   Hx of colposcopy with cervical biopsy 2004; 08/12/16   2004 beningn.  2018 path pending (impression by GYN exam was CIN I)   Seasonal allergies     Past Surgical History:  Procedure Laterality Date   CESAREAN SECTION  2003   and 2006   FLEXIBLE SIGMOIDOSCOPY  03/03/1989   Fissure   oral surgery     anesthesia not used   WISDOM TOOTH EXTRACTION  1989    Current Outpatient Medications  Medication Sig Dispense Refill   cholecalciferol (VITAMIN D3) 25 MCG (1000 UT) tablet Take 1,000 Units by mouth daily.     loratadine (CLARITIN) 10 MG tablet Take 10 mg by mouth daily.     norethindrone (MICRONOR) 0.35 MG tablet Take 1 tablet (0.35 mg total) by mouth daily. 84 tablet 4   PEG-KCl-NaCl-NaSulf-Na Asc-C (PLENVU) 140 g SOLR Take 1 kit by mouth as directed. Manufacturer's coupon Universal coupon code:BIN: P2366821; GROUP: [id number]; PCN: [id number]; ID: [id number]; PAY NO MORE $50; NO prior authorization 1 each 0   Current Facility-Administered Medications  Medication Dose Route Frequency Provider Last Rate Last Admin   0.9 %  sodium chloride infusion  500 mL Intravenous Once Thornton Park, MD        Allergies as of 11/30/2020 -  Review Complete 11/09/2020  Allergen Reaction Noted   Penicillins      Family History  Problem Relation Age of Onset   Alcohol abuse Father    Colon polyps Maternal Aunt    Breast cancer Maternal Aunt    Colon cancer Maternal Aunt    Arthritis Maternal Grandmother    Hyperlipidemia Maternal Grandmother    Hypertension Maternal Grandmother    Depression Maternal Grandmother    Hyperlipidemia Maternal Grandfather    Kidney disease Maternal Grandfather    Heart failure Maternal Grandfather    Cancer Other        breast   Esophageal cancer Neg Hx    Stomach cancer Neg Hx    Rectal cancer Neg Hx      Physical Exam: General:   Alert,  well-nourished, pleasant and cooperative in NAD Head:  Normocephalic and atraumatic. Eyes:  Sclera clear, no icterus.   Conjunctiva pink. Mouth:  No deformity or lesions.   Neck:  Supple; no masses or thyromegaly. Lungs:  Clear throughout to auscultation.   No wheezes. Heart:  Regular rate and rhythm; no murmurs. Abdomen:  Soft, non-tender, nondistended, normal bowel sounds, no rebound or guarding.  Msk:  Symmetrical. No boney deformities LAD: No inguinal or umbilical LAD Extremities:  No clubbing or edema. Neurologic:  Alert and  oriented x4;  grossly nonfocal Skin:  No obvious rash or bruise. Psych:  Alert and cooperative. Normal mood and affect.  Mehkai [name], MD, MPH 11/30/2020, 2:45 PM

## 2020-12-03 ENCOUNTER — Other Ambulatory Visit: Payer: Self-pay | Admitting: Obstetrics & Gynecology

## 2020-12-03 ENCOUNTER — Other Ambulatory Visit: Payer: Self-pay | Admitting: Internal Medicine

## 2020-12-03 DIAGNOSIS — Z1231 Encounter for screening mammogram for malignant neoplasm of breast: Secondary | ICD-10-CM

## 2020-12-04 ENCOUNTER — Telehealth: Payer: Self-pay | Admitting: *Deleted

## 2020-12-04 NOTE — Telephone Encounter (Signed)
  Follow up Call-  Call back number 11/30/2020  Post procedure Call Back phone  # [phone]  Permission to leave phone message Yes  Some recent data might be hidden     Patient questions:  Do you have a fever, pain , or abdominal swelling? No. Pain Score  0 *  Have you tolerated food without any problems? Yes.    Have you been able to return to your normal activities? Yes.    Do you have any questions about your discharge instructions: Diet   No. Medications  No. Follow up visit  No.  Do you have questions or concerns about your Care? No.  Actions: * If pain score is 4 or above: No action needed, pain <4.  Have you developed a fever since your procedure? no  2.   Have you had an respiratory symptoms (SOB or cough) since your procedure? no  3.   Have you tested positive for COVID 19 since your procedure no  4.   Have you had any family members/close contacts diagnosed with the COVID 19 since your procedure?  no   If yes to any of these questions please route to Laverna Peace, RN and Karlton Lemon, RN

## 2020-12-08 ENCOUNTER — Encounter (HOSPITAL_BASED_OUTPATIENT_CLINIC_OR_DEPARTMENT_OTHER): Payer: Self-pay

## 2020-12-31 ENCOUNTER — Ambulatory Visit
Admission: RE | Admit: 2020-12-31 | Discharge: 2020-12-31 | Disposition: A | Discharge status: Home / Self Care | Payer: BC Managed Care – PPO | Source: Ambulatory Visit | Attending: Obstetrics & Gynecology | Admitting: Obstetrics & Gynecology

## 2020-12-31 ENCOUNTER — Other Ambulatory Visit: Payer: Self-pay

## 2020-12-31 DIAGNOSIS — Z1231 Encounter for screening mammogram for malignant neoplasm of breast: Secondary | ICD-10-CM

## 2021-01-02 ENCOUNTER — Encounter (HOSPITAL_BASED_OUTPATIENT_CLINIC_OR_DEPARTMENT_OTHER): Payer: Self-pay

## 2021-01-14 ENCOUNTER — Encounter (HOSPITAL_BASED_OUTPATIENT_CLINIC_OR_DEPARTMENT_OTHER): Payer: Self-pay | Admitting: Cardiology

## 2021-01-14 ENCOUNTER — Other Ambulatory Visit: Payer: Self-pay

## 2021-01-14 ENCOUNTER — Ambulatory Visit (HOSPITAL_BASED_OUTPATIENT_CLINIC_OR_DEPARTMENT_OTHER): Payer: BC Managed Care – PPO | Admitting: Cardiology

## 2021-01-14 VITALS — BP 110/84 | HR 85 | Ht 61.0 in | Wt 114.2 lb

## 2021-01-14 DIAGNOSIS — Z7189 Other specified counseling: Secondary | ICD-10-CM | POA: Diagnosis not present

## 2021-01-14 DIAGNOSIS — Z01818 Encounter for other preprocedural examination: Secondary | ICD-10-CM | POA: Diagnosis not present

## 2021-01-14 DIAGNOSIS — Z87898 Personal history of other specified conditions: Secondary | ICD-10-CM

## 2021-01-14 NOTE — Patient Instructions (Signed)
Medication Instructions:  Your Physician recommend you continue on your current medication as directed.    *If you need a refill on your cardiac medications before your next appointment, please call your pharmacy*   Lab Work: Your provider has recommended lab work (BMP, CBC, Fasting lipid). Please have this collected at [facility] [place]. The lab is open 8:00 am - 4:30 pm. Please avoid 12:00p - 1:00p for lunch hour. You do not need an appointment. Please go to [address] [place], [place] [address]. This is in the Primary Care office on the 3rd floor, let them know you are there for blood work and they will direct you to the lab.  If you have labs (blood work) drawn today and your tests are completely normal, you will receive your results only by: MyChart Message (if you have MyChart) OR A paper copy in the mail If you have any lab test that is abnormal or we need to change your treatment, we will call you to review the results.   Testing/Procedures: None ordered today   Follow-Up: At [facility], you and your health needs are our priority.  As part of our continuing mission to provide you with exceptional heart care, we have created designated Provider Care Teams.  These Care Teams include your primary Cardiologist (physician) and Advanced Practice Providers (APPs -  Physician Assistants and Nurse Practitioners) who all work together to provide you with the care you need, when you need it.  We recommend signing up for the patient portal called "MyChart".  Sign up information is provided on this After Visit Summary.  MyChart is used to connect with patients for Virtual Visits (Telemedicine).  Patients are able to view lab/test results, encounter notes, upcoming appointments, etc.  Non-urgent messages can be sent to your provider as well.   To learn more about what you can do with MyChart, go to [email or web address].    Your next appointment:   As  needed  The format for your next appointment:   In Person  Provider:   Jodelle Red, MD

## 2021-01-14 NOTE — Progress Notes (Signed)
Cardiology Office Note:    Date:  01/16/2021   ID:  Melinda Andersen, DOB 09/07/1970, MRN [id number]  PCP:  Ginger Organ., MD  Cardiologist:  Buford Dresser, MD  Referring MD: Ginger Organ., MD   CC: Follow-up  History of Present Illness:    Melinda Andersen is a 50 y.o. female with no pertinent cardiovascular hx who is seen for follow-up. I initially met her 10/10/2020 as a new consult at the request of Ginger Organ., MD for the evaluation and management of erratic bradycardia during a tooth extraction. She needed cardiology evaluation before tooth extraction could be re-attempted.  Today: Overall, she appears well. She reports her recent tooth extraction and colonoscopy were successful. No issues with anesthesia at all.  Lately she has joined the [employer], and has been exercising. Especially during lunges, she is more aware of her elevated heart rate. However, she does not believe she had any palpitations. She will also work-out using the stair-climbing machine.  She denies any chest pain, or shortness of breath. No lightheadedness, headaches, syncope, orthopnea, PND, or lower extremity edema.   Past Medical History:  Diagnosis Date   Abnormal Pap smear 05/07/2011   [place]    Erythromelalgia ([place]) 04/2009   chronic red painful toes--Dr Geraldo Docker at [facility] (rheumatologist)   Hx of colposcopy with cervical biopsy 2004; 08/12/16   2004 beningn.  2018 path pending (impression by GYN exam was CIN I)   Seasonal allergies     Past Surgical History:  Procedure Laterality Date   CESAREAN SECTION  2003   and 2006   FLEXIBLE SIGMOIDOSCOPY  03/03/1989   Fissure   oral surgery     anesthesia not used   WISDOM TOOTH EXTRACTION  1989    Current Medications: Current Outpatient Medications on File Prior to Visit  Medication Sig   cholecalciferol (VITAMIN D3) 25 MCG (1000 UT) tablet Take 1,000 Units by mouth daily.   norethindrone (MICRONOR) 0.35 MG  tablet Take 1 tablet (0.35 mg total) by mouth daily.   No current facility-administered medications on file prior to visit.     Allergies:   Penicillins   Social History   Tobacco Use   Smoking status: Never   Smokeless tobacco: Never  Vaping Use   Vaping Use: Never used  Substance Use Topics   Alcohol use: Yes    Alcohol/week: 3.0 - 6.0 standard drinks    Types: 3 - 6 Standard drinks or equivalent per week    Comment: occ   Drug use: No    Family History: family history includes Alcohol abuse in her father; Arthritis in her maternal grandmother; Breast cancer in her maternal aunt; Cancer in an other family member; Colon cancer in her maternal aunt; Colon polyps in her maternal aunt; Depression in her maternal grandmother; Heart failure in her maternal grandfather; Hyperlipidemia in her maternal grandfather and maternal grandmother; Hypertension in her maternal grandmother; Kidney disease in her maternal grandfather. There is no history of Esophageal cancer, Stomach cancer, or Rectal cancer.   Father had a stent placed, is a smoker. Mother has hypertension. Grandparents had CAD but lived to 55 yo.  ROS:   Please see the history of present illness. All other systems are reviewed and negative.    EKGs/Labs/Other Studies Reviewed:    The following studies were reviewed today: No prior CV studies available.  EKG:  EKG is personally reviewed.   01/14/2021: not ordered today 10/10/2020: NSR.  Rate 61 bpm.   Recent Labs: No results found for requested labs within last 8760 hours.   Recent Lipid Panel No results found for: CHOL, TRIG, HDL, CHOLHDL, VLDL, LDLCALC, LDLDIRECT  Physical Exam:    VS:  BP 110/84   Pulse 85   Ht _0  (1.549 m)   Wt 114 lb 3.2 oz (51.8 kg)   SpO2 99%   BMI 21.58 kg/m     Wt Readings from Last 3 Encounters:  01/14/21 114 lb 3.2 oz (51.8 kg)  11/30/20 110 lb (49.9 kg)  11/09/20 110 lb (49.9 kg)    GEN: Well nourished, well developed in no  acute distress HEENT: Normal, moist mucous membranes NECK: No JVD CARDIAC: regular rhythm, normal S1 and S2, no rubs or gallops. No murmur. VASCULAR: Radial and DP pulses 2+ bilaterally. No carotid bruits RESPIRATORY:  Clear to auscultation without rales, wheezing or rhonchi  ABDOMEN: Soft, non-tender, non-distended MUSCULOSKELETAL:  Ambulates independently SKIN: Warm and dry, no edema NEUROLOGIC:  Alert and oriented x 3. No focal neuro deficits noted. PSYCHIATRIC:  Normal affect    ASSESSMENT:    1. Pre-op evaluation   2. Cardiac risk counseling   3. Counseling on health promotion and disease prevention   4. History of anesthesia complications     PLAN:    Preoperative evaluation Bradycardia during dental procedure: -has tolerated two procedures, dental and colonoscopy, since initial event. No further issues -I cannot see what medication she had for her colonoscopy but suspect it was propofol -she has another dental procedure coming up. Would use the anesthesia they used at her most recent dental visit as she tolerated this well. -I do not have any monitor results   She is active at baseline, RCRI=0, and the main issue is needing to have her tooth extracted. She has had multiple dental procedures in the past with the periodontist who will be performing the re-attempt at extraction. I would recommend using the anesthesia that she has successfully tolerated before. She does not have a clear contraindication for any anesthesia, though she reports she did not like the nitrous.   Based on AHA guidelines, no further testing required. She may proceed to extraction at acceptable risk.  If heart rate will be monitored by pulse ox, recommend correlation with ECG/monitor if pulse reads low.  Cardiac risk counseling and prevention recommendations: -recommend heart healthy/Mediterranean diet, with whole grains, fruits, vegetable, fish, lean meats, nuts, and olive oil. Limit salt. -recommend  moderate walking, 3-5 times/week for 30-50 minutes each session. Aim for at least 150 minutes.week. Goal should be pace of 3 miles/hours, or walking 1.5 miles in 30 minutes -recommend avoidance of tobacco products. Avoid excess alcohol. -ordered screening labs today  Plan for follow up: PRN.  Buford Dresser, MD, PhD, [facility]    Medication Adjustments/Labs and Tests Ordered: Current medicines are reviewed at length with the patient today.  Concerns regarding medicines are outlined above.   Orders Placed This Encounter  Procedures   Basic metabolic panel   CBC   Lipid panel    No orders of the defined types were placed in this encounter.  Patient Instructions  Medication Instructions:  Your Physician recommend you continue on your current medication as directed.    *If you need a refill on your cardiac medications before your next appointment, please call your pharmacy*   Lab Work: Your provider has recommended lab work (BMP, CBC, Fasting lipid). Please have this collected at  [facility] [place]. The lab is open 8:00 am - 4:30 pm. Please avoid 12:00p - 1:00p for lunch hour. You do not need an appointment. Please go to [phone] [place], [place] [address]. This is in the Primary Care office on the 3rd floor, let them know you are there for blood work and they will direct you to the lab.  If you have labs (blood work) drawn today and your tests are completely normal, you will receive your results only by: [place] (if you have MyChart) OR A paper copy in the mail If you have any lab test that is abnormal or we need to change your treatment, we will call you to review the results.   Testing/Procedures: None ordered today   Follow-Up: At [facility], you and your health needs are our priority.  As part of our continuing mission to provide you with exceptional heart care, we have created designated  Provider Care Teams.  These Care Teams include your primary Cardiologist (physician) and Advanced Practice Providers (APPs -  Physician Assistants and Nurse Practitioners) who all work together to provide you with the care you need, when you need it.  We recommend signing up for the patient portal called "MyChart".  Sign up information is provided on this After Visit Summary.  MyChart is used to connect with patients for Virtual Visits (Telemedicine).  Patients are able to view lab/test results, encounter notes, upcoming appointments, etc.  Non-urgent messages can be sent to your provider as well.   To learn more about what you can do with MyChart, go to [email or web address].    Your next appointment:   As needed  The format for your next appointment:   In Person  Provider:   Buford Dresser, MD       [facility] Stumpf,acting as a scribe for Buford Dresser, MD.,have documented all relevant documentation on the behalf of Buford Dresser, MD,as directed by  Buford Dresser, MD while in the presence of Buford Dresser, MD.  I, Buford Dresser, MD, have reviewed all documentation for this visit. The documentation on 01/16/21 for the exam, diagnosis, procedures, and orders are all accurate and complete.   Signed, Buford Dresser, MD PhD 01/16/2021 10:55 AM    [place]

## 2021-01-16 ENCOUNTER — Encounter (HOSPITAL_BASED_OUTPATIENT_CLINIC_OR_DEPARTMENT_OTHER): Payer: Self-pay | Admitting: Cardiology

## 2021-04-15 ENCOUNTER — Telehealth (HOSPITAL_BASED_OUTPATIENT_CLINIC_OR_DEPARTMENT_OTHER): Payer: Self-pay | Admitting: Obstetrics & Gynecology

## 2021-04-15 NOTE — Telephone Encounter (Signed)
Called patient back and left a message to please call the office back.

## 2021-04-19 DIAGNOSIS — Z01818 Encounter for other preprocedural examination: Secondary | ICD-10-CM | POA: Diagnosis not present

## 2021-04-19 LAB — BASIC METABOLIC PANEL
BUN/Creatinine Ratio: 15 (ref 9–23)
BUN: 11 mg/dL (ref 6–24)
CO2: 24 mmol/L (ref 20–29)
Calcium: 9.5 mg/dL (ref 8.7–10.2)
Chloride: 104 mmol/L (ref 96–106)
Creatinine, Ser: 0.72 mg/dL (ref 0.57–1.00)
Glucose: 97 mg/dL (ref 70–99)
Potassium: 5.1 mmol/L (ref 3.5–5.2)
Sodium: 141 mmol/L (ref 134–144)
eGFR: 102 mL/min/{1.73_m2} (ref >59)

## 2021-04-19 LAB — LIPID PANEL
Chol/HDL Ratio: 2.7 ratio (ref 0.0–4.4)
Cholesterol, Total: 189 mg/dL (ref 100–199)
HDL: 69 mg/dL (ref >39)
LDL Chol Calc (NIH): 108 mg/dL — ABNORMAL HIGH (ref 0–99)
Triglycerides: 67 mg/dL (ref 0–149)
VLDL Cholesterol Cal: 12 mg/dL (ref 5–40)

## 2021-04-19 LAB — CBC
Hematocrit: 44.6 % (ref 34.0–46.6)
Hemoglobin: 14.4 g/dL (ref 11.1–15.9)
MCH: 31 pg (ref 26.6–33.0)
MCHC: 32.3 g/dL (ref 31.5–35.7)
MCV: 96 fL (ref 79–97)
Platelets: 267 10*3/uL (ref 150–450)
RBC: 4.65 x10E6/uL (ref 3.77–5.28)
RDW: 12.8 % (ref 11.7–15.4)
WBC: 4.4 10*3/uL (ref 3.4–10.8)

## 2021-05-31 ENCOUNTER — Ambulatory Visit (INDEPENDENT_AMBULATORY_CARE_PROVIDER_SITE_OTHER): Payer: BC Managed Care – PPO | Admitting: Obstetrics & Gynecology

## 2021-05-31 ENCOUNTER — Encounter (HOSPITAL_BASED_OUTPATIENT_CLINIC_OR_DEPARTMENT_OTHER): Payer: Self-pay | Admitting: Obstetrics & Gynecology

## 2021-05-31 VITALS — BP 151/89 | HR 67 | Ht 61.0 in | Wt 110.0 lb

## 2021-05-31 DIAGNOSIS — Z8742 Personal history of other diseases of the female genital tract: Secondary | ICD-10-CM | POA: Diagnosis not present

## 2021-05-31 DIAGNOSIS — N912 Amenorrhea, unspecified: Secondary | ICD-10-CM | POA: Diagnosis not present

## 2021-05-31 DIAGNOSIS — Z01419 Encounter for gynecological examination (general) (routine) without abnormal findings: Secondary | ICD-10-CM | POA: Diagnosis not present

## 2021-05-31 DIAGNOSIS — K4090 Unilateral inguinal hernia, without obstruction or gangrene, not specified as recurrent: Secondary | ICD-10-CM

## 2021-05-31 MED ORDER — NORETHINDRONE 0.35 MG PO TABS: 1.0000 | ORAL_TABLET | Freq: Every day | ORAL | 4 refills | Status: DC

## 2021-05-31 NOTE — Progress Notes (Signed)
51 y.o. G61P2002 [name] here for annual exam.  Doing well.  Denies vaginal bleeding.  On micronor.  AMH was lower last year.  Pt wonders about continuing micronor. ? ?PCP:  Dr. Clelia Croft ? ?No LMP recorded. (Menstrual status: Oral contraceptives).          ?Sexually active: Yes.    ?The current method of family planning is oral progesterone-only contraceptive.    ?Exercising: Yes.     sagewell ?Smoker:  no ? ?Health Maintenance: ?Pap:  05/24/20 neg ?History of abnormal Pap:  2013 with Dr. Stefano Gaul ?MMG:  12/31/20 neg ?Colonoscopy:  11/30/20, follow up 10 years ?BMD:   guidelines reviewed ?Screening Labs: does with Dr. Clelia Croft ? ? reports that she has never smoked. She has never used smokeless tobacco. She reports current alcohol use of about 3.0 - 6.0 standard drinks per week. She reports that she does not use drugs. ? ?Past Medical History:  ?Diagnosis Date  ? Abnormal Pap smear 05/07/2011  ? ascus   ? Erythromelalgia ([facility]) 04/2009  ? chronic red painful toes--Dr Christen Bame at [facility] (rheumatologist)  ? Hx of colposcopy with cervical biopsy 2004; 08/12/16  ? 2004 beningn.  2018 path pending (impression by GYN exam was CIN I)  ? Seasonal allergies   ? ? ?Past Surgical History:  ?Procedure Laterality Date  ? CESAREAN SECTION  2003  ? and 2006  ? FLEXIBLE SIGMOIDOSCOPY  03/03/1989  ? Fissure  ? oral surgery    ? anesthesia not used  ? WISDOM TOOTH EXTRACTION  1989  ? ? ?Current Outpatient Medications  ?Medication Sig Dispense Refill  ? cholecalciferol (VITAMIN D3) 25 MCG (1000 UT) tablet Take 1,000 Units by mouth daily.    ? fluticasone (FLONASE) 50 MCG/ACT nasal spray     ? loratadine (CLARITIN) 10 MG tablet     ? norethindrone (MICRONOR) 0.35 MG tablet Take 1 tablet (0.35 mg total) by mouth daily. 84 tablet 4  ? ?No current facility-administered medications for this visit.  ? ? ?Family History  ?Problem Relation Age of Onset  ? Arthritis Maternal Grandmother   ? Hyperlipidemia Maternal Grandmother   ?  Hypertension Maternal Grandmother   ? Depression Maternal Grandmother   ? Hyperlipidemia Maternal Grandfather   ? Kidney disease Maternal Grandfather   ? Heart failure Maternal Grandfather   ? Alcohol abuse Father   ? Colon polyps Maternal Aunt   ? Breast cancer Maternal Aunt   ? Colon cancer Maternal Aunt   ? Cancer Other   ?     breast  ? Esophageal cancer Neg Hx   ? Stomach cancer Neg Hx   ? Rectal cancer Neg Hx   ? ? ?Review of Systems  ?All other systems reviewed and are negative. ? ?Exam:   ?BP (!) 151/89   Pulse 67   Ht 5\' 1"  (1.549 m)   Wt 110 lb (49.9 kg)   BMI 20.78 kg/m?   Height: 5\' 1"  (154.9 cm) ? ?General appearance: alert, cooperative and appears stated age ?Head: Normocephalic, without obvious abnormality, atraumatic ?Neck: no adenopathy, supple, symmetrical, trachea midline and thyroid normal to inspection and palpation ?Lungs: clear to auscultation bilaterally ?Breasts: normal appearance, no masses or tenderness ?Heart: regular rate and rhythm ?Abdomen: soft, non-tender; bowel sounds normal; no masses,  no organomegaly ?Extremities: extremities normal, atraumatic, no cyanosis or edema ?Skin: Skin color, texture, turgor normal. No rashes or lesions ?Lymph nodes: Cervical, supraclavicular, and axillary nodes normal. ?No abnormal inguinal nodes  palpated ?Neurologic: Grossly normal ? ? ?Pelvic: External genitalia:  no lesions ?             Urethra:  normal appearing urethra with no masses, tenderness or lesions ?             Bartholins and Skenes: normal    ?             Vagina: normal appearing vagina with normal color and no discharge, no lesions ?             Cervix: no lesions ?             Pap taken: No. ?Bimanual Exam:  Uterus:  normal size, contour, position, consistency, mobility, non-tender ?             Adnexa: normal adnexa and no mass, fullness, tenderness ?              Rectovaginal: Confirms ?              Anus:  normal sphincter tone, no lesions ? ?Chaperone, Raechel Ache, RN, was  present for exam. ? ?Assessment/Plan: ?1. Well woman exam with routine gynecological exam ?- Pap smear neg with neg HR HPV 05/2020 ?- Mammogram 12/2020 ?- Colonoscopy 11/2020, follow up 10 years ?- Bone mineral density guidelines reviewed ?- lab work done done with PCP ?- vaccines reviewed/updated ? ?2. Amenorrhea ?- pt is going to stop OCPs for two weeks and then have FSH done.  She will call when ready for lab work.  Future order placed. ?- Follicle stimulating hormone; Future ? ?3. History of abnormal cervical Pap smear ?- in 2013 with Dr. Stefano Gaul ? ?4. Direct left inguinal hernia ?- saw Dr. Derrell Lolling for consult.  Has not had repaired at this time.  ?

## 2021-06-02 ENCOUNTER — Encounter (HOSPITAL_BASED_OUTPATIENT_CLINIC_OR_DEPARTMENT_OTHER): Payer: Self-pay | Admitting: Obstetrics & Gynecology

## 2021-06-20 DIAGNOSIS — L814 Other melanin hyperpigmentation: Secondary | ICD-10-CM | POA: Diagnosis not present

## 2021-06-20 DIAGNOSIS — X32XXXS Exposure to sunlight, sequela: Secondary | ICD-10-CM | POA: Diagnosis not present

## 2021-06-20 DIAGNOSIS — L578 Other skin changes due to chronic exposure to nonionizing radiation: Secondary | ICD-10-CM | POA: Diagnosis not present

## 2021-07-08 ENCOUNTER — Encounter (HOSPITAL_BASED_OUTPATIENT_CLINIC_OR_DEPARTMENT_OTHER): Payer: Self-pay | Admitting: Obstetrics & Gynecology

## 2021-07-10 ENCOUNTER — Other Ambulatory Visit (HOSPITAL_BASED_OUTPATIENT_CLINIC_OR_DEPARTMENT_OTHER): Payer: BC Managed Care – PPO

## 2021-07-10 DIAGNOSIS — N912 Amenorrhea, unspecified: Secondary | ICD-10-CM

## 2021-07-11 LAB — FOLLICLE STIMULATING HORMONE: FSH: 6.6 m[IU]/mL

## 2021-07-15 ENCOUNTER — Other Ambulatory Visit (HOSPITAL_BASED_OUTPATIENT_CLINIC_OR_DEPARTMENT_OTHER): Payer: Self-pay | Admitting: Obstetrics & Gynecology

## 2021-07-15 ENCOUNTER — Encounter (HOSPITAL_BASED_OUTPATIENT_CLINIC_OR_DEPARTMENT_OTHER): Payer: Self-pay | Admitting: *Deleted

## 2021-07-16 ENCOUNTER — Telehealth: Payer: Self-pay | Admitting: Cardiology

## 2021-07-16 NOTE — Telephone Encounter (Signed)
Thank-you for letting me know.  ?Please cancel the order as she is established with [facility].  ? ?Best Regards,  ? ?Dr. Odis Hollingshead

## 2021-07-16 NOTE — Telephone Encounter (Signed)
Pt called to have [place] cancel her imaging orders and stated she is no longer a pt of or practice. ?

## 2021-08-26 DIAGNOSIS — R238 Other skin changes: Secondary | ICD-10-CM | POA: Diagnosis not present

## 2021-09-19 DIAGNOSIS — A09 Infectious gastroenteritis and colitis, unspecified: Secondary | ICD-10-CM | POA: Diagnosis not present

## 2021-11-13 NOTE — Progress Notes (Signed)
11/14/2021 Melinda Andersen [id number] [birth year]  Referring provider: Cleatis Polka., MD Primary GI doctor: Dr. Orvan Falconer  ASSESSMENT AND PLAN:   Assessment: 51 y.o. female here for assessment of the following: 1. Diarrhea, unspecified type   2. Direct left inguinal hernia   3. Borborygmus    2022 normal colonoscopy, no biopsied taken   Plan: With continuing diarrhea, will evaluate for infection, could be post infectious IBS. Will check Xray with inguinal hernia and tortuous colon Will check for celiac.  Given FODMAP information and discussed diet.  Will check for SIBO with hydrogen breath test if stool and blood test are all negative versus trial of Xifaxan 550 mg TID x 14 days  Orders Placed This Encounter  Procedures   Ova and parasite examination   DG Abd 1 View   CBC with Differential/Platelet   Comprehensive metabolic panel   TSH   IgA   Tissue transglutaminase, IgA   GI Profile, Stool, PCR    History of Present Illness:  51 y.o. female  with a past medical history of female stress incontinence, left inguinal hernia not repaired and others listed below, returns to clinic today for evaluation of diarrhea.  11/30/2020 colonoscopy with Dr. Orvan Falconer for routine screening excellent bowel prep, tortuous colon nonbleeding external/internal hemorrhoids otherwise unremarkable.  Recall 10 years.  09/19/2021 office visit with PCP for diarrhea started 09/13/2021 bowel movements twice daily liquid and worse borborygmi Denies nausea, vomiting, fevers.  Taking align without help, florastor without help.  No new medications CBC and CMET unremarkable that visit, no stool studies, given Xifaxan 550 twice daily for 3 days for possible stool infection with recent trip to [place]. 10/23/2021 periodic loose stools, mucus.  The days of the week GI issues. States the stools are better but not complete gone.  She has mainly diarrhea twice a day, occ more in the evening but can  have rare formed stools still.  She can have very bad gas, excessive  borborygmi. 2 nights ago she woke up with excessive gas and AB pain, then had gas with white foam and mucus, no BM.  No Ab pain except with the gas.  Denies hematochezia.  No fever, chills. No nausea, vomiting, GERD.  Denies weight loss.  No one sick around her, has had negative COVID, works with long term facility.  No new medications, no new OTC meds.  Dog with giardia and taper worm in June.  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 She has never used smokeless tobacco. She reports current alcohol use of about 3.0 - 6.0 standard drinks of alcohol per week. She reports that she does not use drugs. Her family history includes Alcohol abuse in her father; Arthritis in her maternal grandmother; Breast cancer in her maternal aunt; Cancer in an other family member; Colon cancer in her maternal aunt; Colon polyps in her maternal aunt; Depression in her maternal grandmother; Heart failure in her maternal grandfather; Hyperlipidemia in her maternal grandfather and maternal grandmother; Hypertension in her maternal grandmother; Kidney disease in her maternal grandfather.   Current Medications:   Current Outpatient Medications (Endocrine & Metabolic):    norethindrone (MICRONOR) 0.35 MG tablet, Take 1 tablet (0.35 mg total) by mouth daily.   Current Outpatient Medications (Respiratory):    fluticasone (FLONASE) 50 MCG/ACT nasal spray,    loratadine (CLARITIN) 10 MG tablet,     Current Outpatient Medications (Other):    cholecalciferol (VITAMIN D3) 25  MCG (1000 UT) tablet, Take 1,000 Units by mouth daily.  Surgical History:  She  has a past surgical history that includes Cesarean section (2003); Wisdom tooth extraction (1989); oral surgery; and Flexible sigmoidoscopy (03/03/1989).  Current Medications, Allergies, Past Medical History, Past Surgical History, Family History and Social History were  reviewed in [employer] record.  Physical Exam: BP 116/86   Pulse 69   Ht 5\' 1"  (1.549 m)   Wt 110 lb (49.9 kg)   BMI 20.78 kg/m  General:   Pleasant, well developed female in no acute distress Heart : Regular rate and rhythm; no murmurs Pulm: Clear anteriorly; no wheezing Abdomen:  Soft, Flat AB, Hyperactive, tinkling bowel sounds. No tenderness . , No organomegaly appreciated. Rectal: Not evaluated Extremities:  without  edema. Neurologic:  Alert and  oriented x4;  No focal deficits.  Psych:  Cooperative. Normal mood and affect.   , PA-C 11/14/21

## 2021-11-14 ENCOUNTER — Other Ambulatory Visit (INDEPENDENT_AMBULATORY_CARE_PROVIDER_SITE_OTHER): Payer: BC Managed Care – PPO

## 2021-11-14 ENCOUNTER — Ambulatory Visit (INDEPENDENT_AMBULATORY_CARE_PROVIDER_SITE_OTHER)
Admission: RE | Admit: 2021-11-14 | Discharge: 2021-11-14 | Disposition: A | Discharge status: Home / Self Care | Payer: BC Managed Care – PPO | Source: Ambulatory Visit | Attending: Physician Assistant | Admitting: Physician Assistant

## 2021-11-14 ENCOUNTER — Ambulatory Visit: Payer: BC Managed Care – PPO | Admitting: Physician Assistant

## 2021-11-14 ENCOUNTER — Encounter: Payer: Self-pay | Admitting: Physician Assistant

## 2021-11-14 VITALS — BP 116/86 | HR 69 | Ht 61.0 in | Wt 110.0 lb

## 2021-11-14 DIAGNOSIS — R197 Diarrhea, unspecified: Secondary | ICD-10-CM

## 2021-11-14 DIAGNOSIS — K4090 Unilateral inguinal hernia, without obstruction or gangrene, not specified as recurrent: Secondary | ICD-10-CM | POA: Diagnosis not present

## 2021-11-14 DIAGNOSIS — R198 Other specified symptoms and signs involving the digestive system and abdomen: Secondary | ICD-10-CM

## 2021-11-14 LAB — COMPREHENSIVE METABOLIC PANEL
ALT: 11 U/L (ref 0–35)
AST: 16 U/L (ref 0–37)
Albumin: 4.1 g/dL (ref 3.5–5.2)
Alkaline Phosphatase: 53 U/L (ref 39–117)
BUN: 11 mg/dL (ref 6–23)
CO2: 26 mEq/L (ref 19–32)
Calcium: 9 mg/dL (ref 8.4–10.5)
Chloride: 104 mEq/L (ref 96–112)
Creatinine, Ser: 0.71 mg/dL (ref 0.40–1.20)
GFR: 98.49 mL/min (ref >60.00)
Glucose, Bld: 89 mg/dL (ref 70–99)
Potassium: 3.9 mEq/L (ref 3.5–5.1)
Sodium: 137 mEq/L (ref 135–145)
Total Bilirubin: 0.2 mg/dL (ref 0.2–1.2)
Total Protein: 7 g/dL (ref 6.0–8.3)

## 2021-11-14 LAB — CBC WITH DIFFERENTIAL/PLATELET
Basophils Absolute: 0 10*3/uL (ref 0.0–0.1)
Basophils Relative: 0.9 % (ref 0.0–3.0)
Eosinophils Absolute: 0 10*3/uL (ref 0.0–0.7)
Eosinophils Relative: 1 % (ref 0.0–5.0)
HCT: 41.3 % (ref 36.0–46.0)
Hemoglobin: 14.2 g/dL (ref 12.0–15.0)
Lymphocytes Relative: 36.7 % (ref 12.0–46.0)
Lymphs Abs: 1.5 10*3/uL (ref 0.7–4.0)
MCHC: 34.3 g/dL (ref 30.0–36.0)
MCV: 94.1 fl (ref 78.0–100.0)
Monocytes Absolute: 0.6 10*3/uL (ref 0.1–1.0)
Monocytes Relative: 14.9 % — ABNORMAL HIGH (ref 3.0–12.0)
Neutro Abs: 1.9 10*3/uL (ref 1.4–7.7)
Neutrophils Relative %: 46.5 % (ref 43.0–77.0)
Platelets: 230 10*3/uL (ref 150.0–400.0)
RBC: 4.39 Mil/uL (ref 3.87–5.11)
RDW: 12.9 % (ref 11.5–15.5)
WBC: 4.1 10*3/uL (ref 4.0–10.5)

## 2021-11-14 LAB — TSH: TSH: 2.39 u[IU]/mL (ref 0.35–5.50)

## 2021-11-14 NOTE — Patient Instructions (Addendum)
Your provider has requested that you go to the basement level for lab work before leaving today. Press "B" on the elevator. The lab is located at the first door on the left as you exit the elevator.   You may have POST INFECTIOUS IBS OR IRRITABLE BOWEL After an infection  your intestines can spasm or be a little bit more sensitive. Try these things below:  Try trial off milk/lactose products.  Can do trial of IBGard for AB pain EVERY DAY- Take 1-2 capsules once a day for maintence or twice a day during a flare if any worsening symptoms like blood in stool, weight loss, please call the office or go to the ER.   We may want to evaluate you for small intestinal bacterial overgrowth, this can cause increase gas, bloating, loose stools or constipation.  There is a test for this we can do or sometimes we will treat a patient with an antibiotic to see if it helps.   FODMAP stands for fermentable oligo-, di-, mono-saccharides and polyols (1). These are the scientific terms used to classify groups of carbs that are notorious for triggering digestive symptoms like bloating, gas and stomach pain.

## 2021-11-15 ENCOUNTER — Other Ambulatory Visit: Payer: BC Managed Care – PPO

## 2021-11-15 DIAGNOSIS — A048 Other specified bacterial intestinal infections: Secondary | ICD-10-CM | POA: Diagnosis not present

## 2021-11-15 DIAGNOSIS — R197 Diarrhea, unspecified: Secondary | ICD-10-CM | POA: Diagnosis not present

## 2021-11-15 DIAGNOSIS — A09 Infectious gastroenteritis and colitis, unspecified: Secondary | ICD-10-CM | POA: Diagnosis not present

## 2021-11-15 LAB — TISSUE TRANSGLUTAMINASE, IGA: (tTG) Ab, IgA: <1 U/mL

## 2021-11-15 LAB — IGA: Immunoglobulin A: 168 mg/dL (ref 47–310)

## 2021-11-17 NOTE — Progress Notes (Signed)
Reviewed and agree with management plans. ? ?Ascher Schroepfer L. Cailin Gebel, MD, MPH  ?

## 2021-11-18 ENCOUNTER — Encounter: Payer: Self-pay | Admitting: Gastroenterology

## 2021-11-18 DIAGNOSIS — K580 Irritable bowel syndrome with diarrhea: Secondary | ICD-10-CM

## 2021-11-19 LAB — GI PROFILE, STOOL, PCR

## 2021-11-25 LAB — OVA AND PARASITE EXAMINATION
CONCENTRATE RESULT:: NONE SEEN
MICRO NUMBER:: 13923701
SPECIMEN QUALITY:: ADEQUATE
TRICHROME RESULT:: NONE SEEN

## 2021-11-28 MED ORDER — RIFAXIMIN 550 MG PO TABS: 550.0000 mg | ORAL_TABLET | Freq: Three times a day (TID) | ORAL | 0 refills | Status: AC

## 2022-01-01 ENCOUNTER — Other Ambulatory Visit: Payer: Self-pay | Admitting: Certified Registered Nurse Anesthetist

## 2022-01-01 ENCOUNTER — Other Ambulatory Visit: Payer: Self-pay | Admitting: Obstetrics & Gynecology

## 2022-01-01 DIAGNOSIS — Z1231 Encounter for screening mammogram for malignant neoplasm of breast: Secondary | ICD-10-CM

## 2022-01-01 IMAGING — CT CT ABD-PELV W/ CM
2 of 5 series · 13 of 46 positions shown, 15 images · IV contrast (iopamidol)
Comparison: None.

CLINICAL DATA: Abdominal pain.  Inguinal hernia suspected.

EXAM:
CT ABDOMEN AND PELVIS WITH CONTRAST
TECHNIQUE: Multidetector CT imaging of the abdomen and pelvis was performed
using the standard protocol following bolus administration of
intravenous contrast.
CONTRAST:  100mL 7YFBYV-ZTT IOPAMIDOL (7YFBYV-ZTT) INJECTION 61%

[Series 2: abd pelvis 5.00 br40 s3 axial · axial · 0.50mm/px · z∈[+1325,+1670]mm · 10 of 79 slices shown, 12 images]
[im 5/79  soft-tissue]
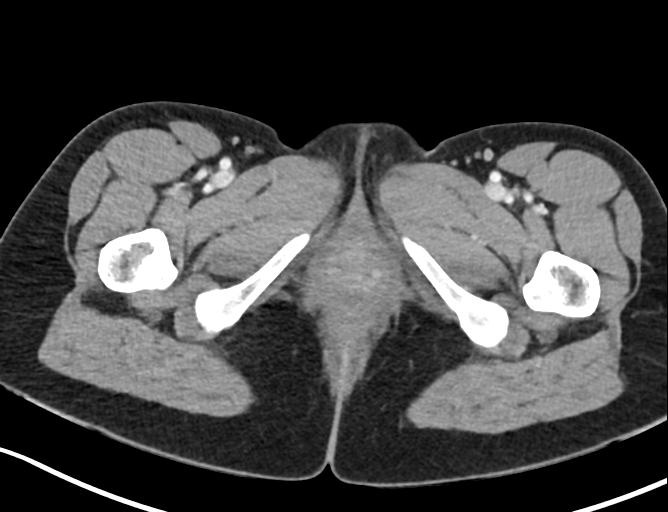
[im 5/79  bone]
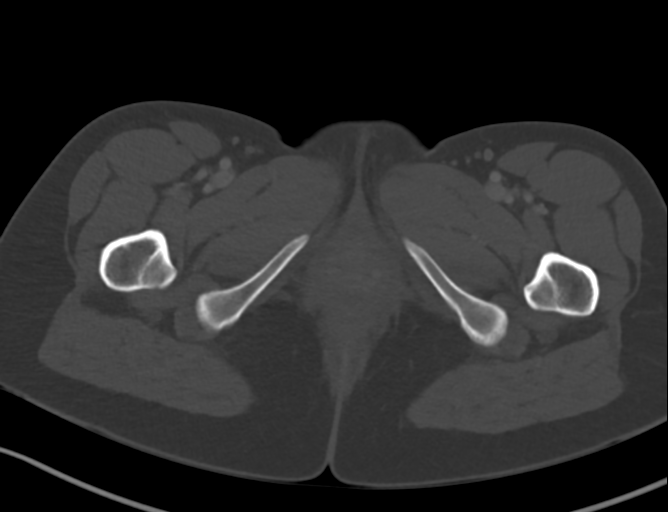
[im 13/79  soft-tissue]
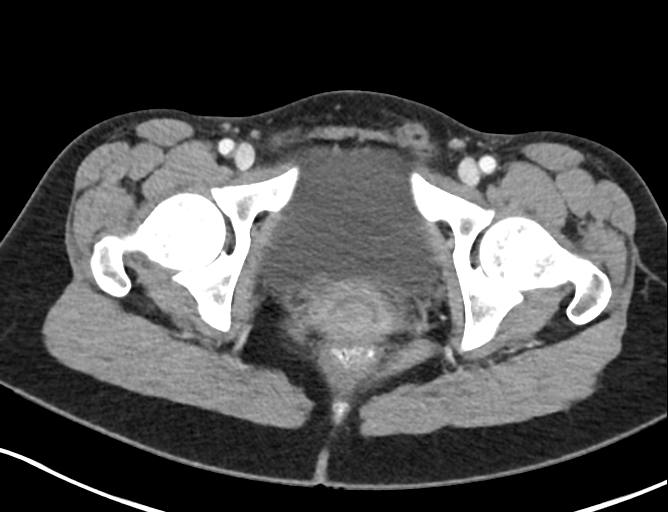
[im 21/79  soft-tissue]
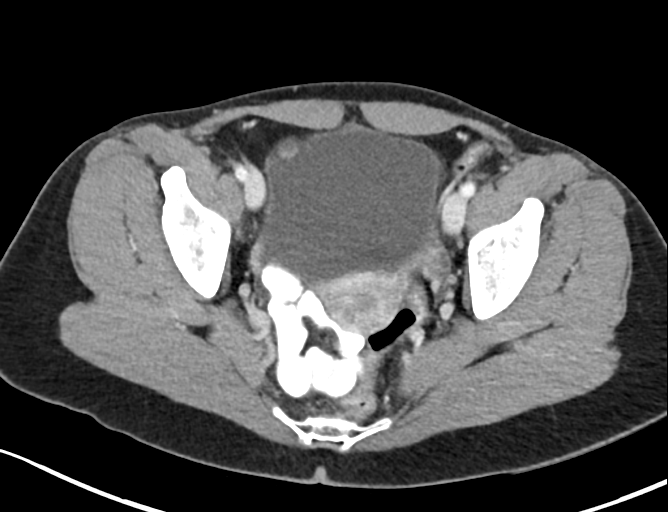
[im 29/79  soft-tissue]
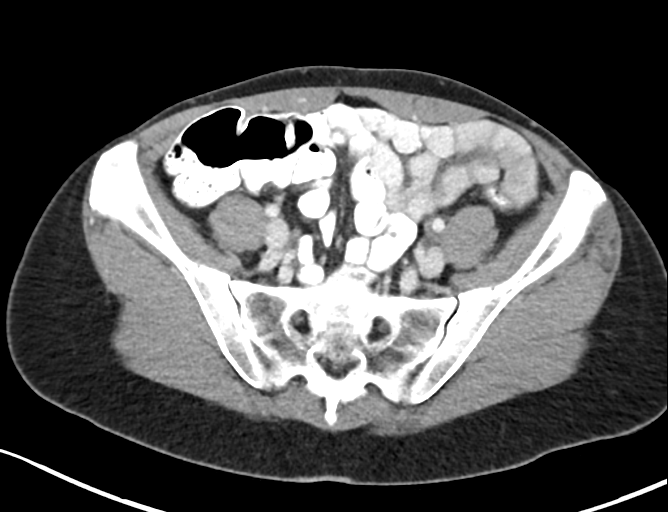
[im 37/79  soft-tissue]
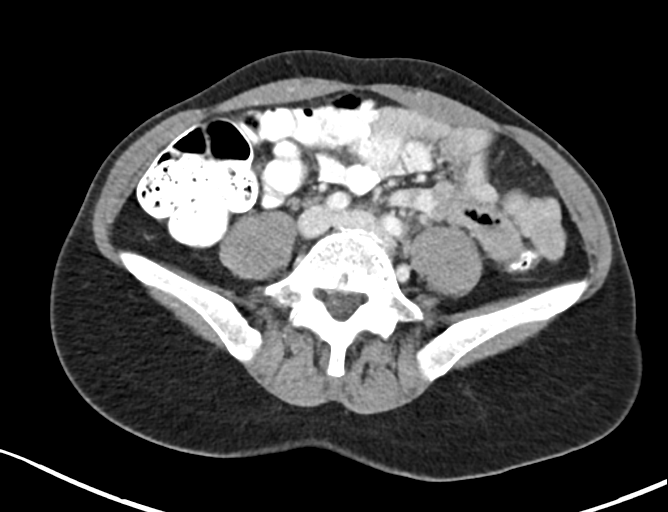
[im 42/79  soft-tissue]
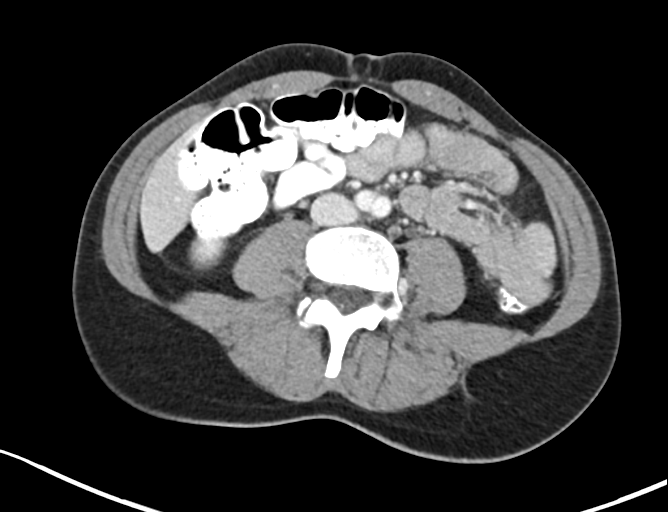
[im 50/79  soft-tissue]
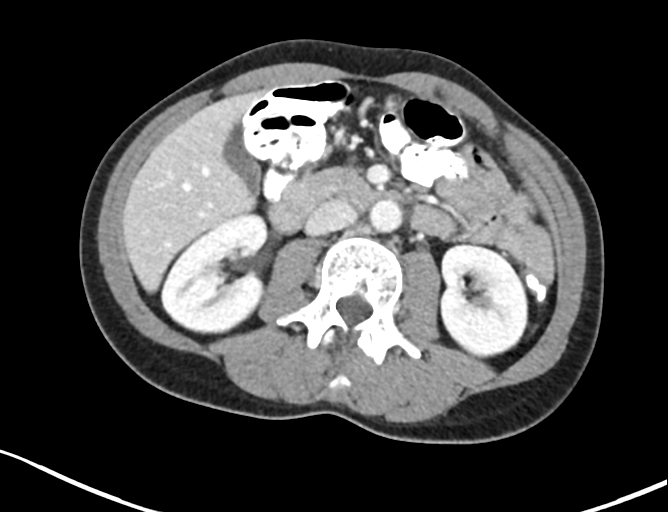
[im 58/79  soft-tissue]
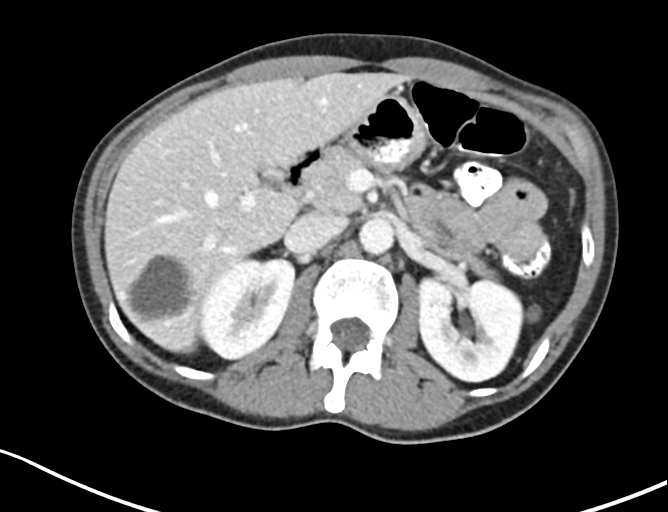
[im 66/79  soft-tissue]
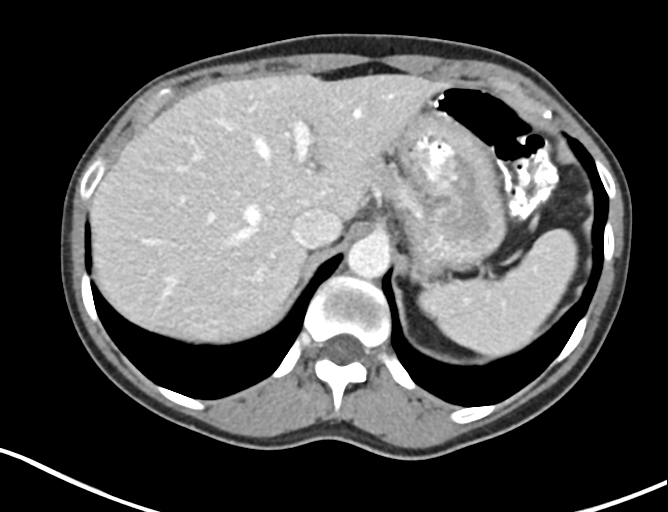
[im 66/79  bone]
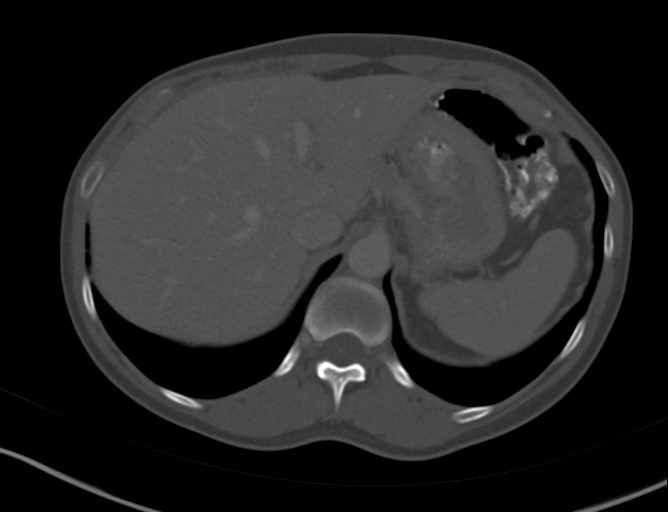
[im 74/79  soft-tissue]
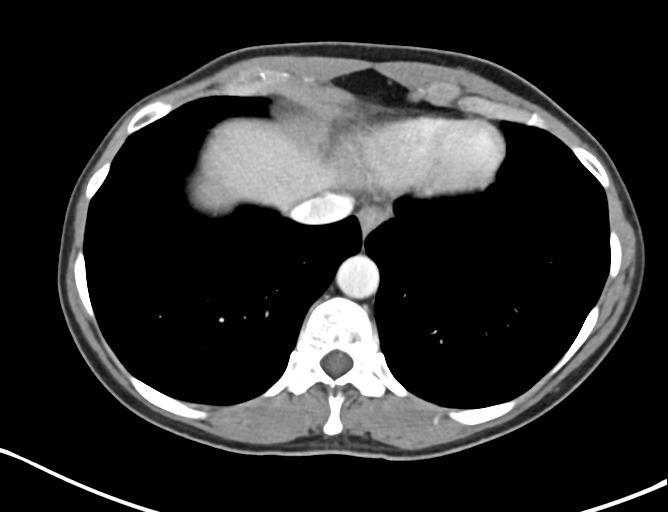

[Series 6: abd pelvis 2.00 br40 s3 cor · coronal · 0.65mm/px · 3 of 126 slices shown]
[im 42/126  soft-tissue]
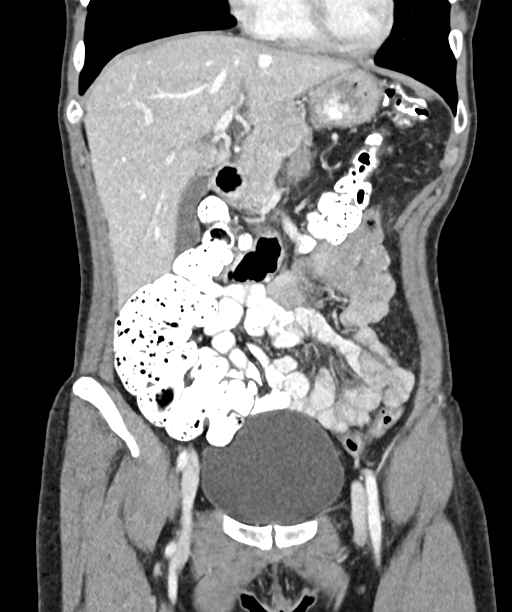
[im 56/126  soft-tissue]
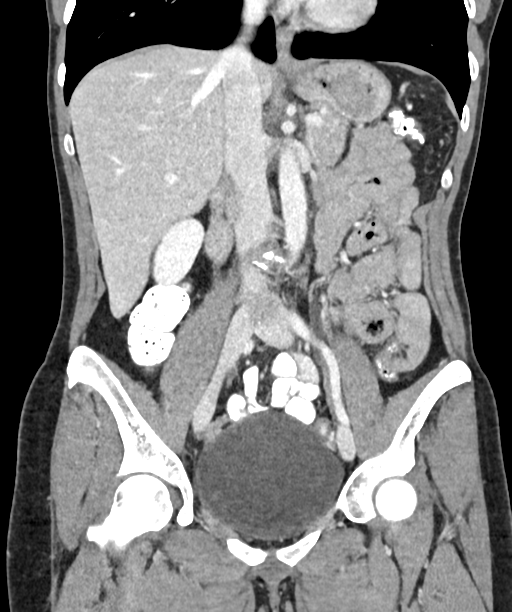
[im 70/126  soft-tissue]
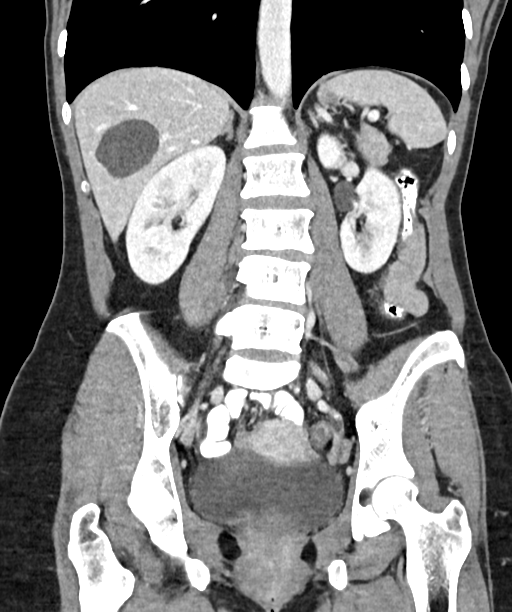

[13 of 46 positions shown; findings below may reference images not displayed]

FINDINGS: Lower chest: Lung bases are clear.

Hepatobiliary: Benign cyst in the RIGHT hepatic lobe measuring 4 cm.
No biliary duct dilatation. Gallbladder is normal. Common bile duct
is normal.

Pancreas: Pancreas is normal. No ductal dilatation. No pancreatic
inflammation.

Spleen: Normal spleen

Adrenals/urinary tract: Adrenal glands and kidneys are normal.
Nonobstructing calculus in the upper pole of the LEFT kidney. The
ureters and bladder normal.

Stomach/Bowel: Stomach, small bowel, appendix, and cecum are normal.
The colon and rectosigmoid colon are normal.

Vascular/Lymphatic: Abdominal aorta is normal caliber. No periportal
or retroperitoneal adenopathy. No pelvic adenopathy.

Reproductive: Uterus and adnexa unremarkable.

Other: Tiny LEFT inguinal hernia measuring 13 mm on image 67/2.
Tiny fat filled 8 mm umbilical hernia.

Musculoskeletal: No aggressive osseous lesion.
IMPRESSION: 1. Very small LEFT inguinal hernia.
2. Very small umbilical hernia.
3. No acute findings in the abdomen pelvis.

## 2022-02-25 ENCOUNTER — Encounter: Payer: Self-pay | Admitting: Gastroenterology

## 2022-02-25 ENCOUNTER — Ambulatory Visit: Payer: BC Managed Care – PPO | Admitting: Gastroenterology

## 2022-02-25 ENCOUNTER — Other Ambulatory Visit: Payer: BC Managed Care – PPO

## 2022-02-25 VITALS — BP 136/84 | HR 81 | Ht 61.0 in | Wt 110.1 lb

## 2022-02-25 DIAGNOSIS — R197 Diarrhea, unspecified: Secondary | ICD-10-CM | POA: Diagnosis not present

## 2022-02-25 DIAGNOSIS — R194 Change in bowel habit: Secondary | ICD-10-CM | POA: Diagnosis not present

## 2022-02-25 MED ORDER — RIFAXIMIN 550 MG PO TABS: 550.0000 mg | ORAL_TABLET | Freq: Three times a day (TID) | ORAL | 0 refills | Status: AC

## 2022-02-25 NOTE — Progress Notes (Signed)
Referring Provider: Ginger Organ., MD Primary Care Physician:  Ginger Organ., MD  Chief Complaint:  Diarrhea   IMPRESSION:  Change in bowel habits with chronic diarrhea with presentation that sounds most consistent with post-IBS  Normal screening colonoscopy 11/30/20  PLAN: - Complete evaluation with fecal calprotectin, pancreatic elastase - Xifaxan 552m TID x 14 days - Discussed natural prebiotic options versus probiotics - Office follow-up with AEstill Bambergor Dr. BTarri Glennin 5-6 weeks  HPI: KLURIA ROSARIOis a 51y.o. female who returns in follow-up after her office visit with AVicie Mutters9/18/2023.  She has a history of stress incontinence, left inguinal hernia not repaired, and other problems as seen in the problem list below.  I met her at the time of a normal screening colonoscopy in 2022.  Diarrhea was not a reported symptom at that time and random biopsies were not performed.  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  Several morning poorly formed bowel movements. If she has evening bowel movements things are more formed. Frequent mucous particularly in the morning. No improvement despite align, Florastor, IBGuard, 3 days of Xifaxan for possible traveler's diarrhea, 14 days of Xifaxan in October as the symptoms [place]. After the second round of Xifaxan things are better but still not normal.   No identified food triggers. No associated stressors. No blood. She has stoopped using straws and is avoiding carbonated water. No sugar substitutes. Avoiding beans and carniferous vegetables.   Ova and parasite and GI pathogen panel were negative. CBC was normal except for very mild elevation in her monocytes. TTGA, IgA and TSH were normal in September  Abdominal x-ray showed a moderate amount of stool in  the colon  No extraGI manifestations  Motivated to find a cause  Maternal aunt had colon polyps and colon cancer.  There is no other known family history of colon cancer or polyps. No family history of stomach cancer or other GI malignancy. No family history of inflammatory bowel disease or celiac.    Past Medical History:  Diagnosis Date   Abnormal Pap smear 05/07/2011   [place]    [place]   [facility]   Hx of colposcopy with cervical biopsy 2004; 08/12/16   2004 beningn.  2018 path pending (impression by GYN exam was CIN I)   Seasonal allergies     Past Surgical History:  Procedure Laterality Date   CESAREAN SECTION  2003   and 2006   FLEXIBLE SIGMOIDOSCOPY  03/03/1989   Fissure   oral surgery     anesthesia not used   WISDOM TOOTH EXTRACTION  1989   Current Outpatient Medications  Medication Sig Dispense Refill   cholecalciferol (VITAMIN D3) 25 MCG (1000 UT) tablet Take 1,000 Units by mouth daily.     norethindrone (MICRONOR) 0.35 MG tablet Take 1 tablet (0.35 mg total) by mouth daily. 84 tablet 4   fluticasone (FLONASE) 50 MCG/ACT nasal spray      loratadine (CLARITIN) 10 MG tablet      No current facility-administered medications for this visit.    Allergies as of 02/25/2022 - Review Complete 02/25/2022  Allergen Reaction Noted   Penicillins      Family History  Problem Relation Age of Onset   Arthritis Maternal  Grandmother    Hyperlipidemia Maternal Grandmother    Hypertension Maternal Grandmother    Depression Maternal Grandmother    Hyperlipidemia Maternal Grandfather    Kidney disease Maternal Grandfather    Heart failure Maternal Grandfather    Alcohol abuse Father    Colon polyps Maternal Aunt    Breast cancer Maternal Aunt    Colon cancer Maternal Aunt    Cancer Other        breast   Esophageal cancer Neg Hx    Stomach cancer Neg Hx    Rectal cancer Neg Hx        Physical Exam: General:   Alert,  well-nourished, pleasant and cooperative in NAD Head:  Normocephalic and atraumatic. Eyes:  Sclera clear, no icterus.   Conjunctiva pink. Abdomen:  Soft, nontender, nondistended, normal bowel sounds, no rebound or guarding. No hepatosplenomegaly.   Neurologic:  Alert and  oriented x4;  grossly nonfocal Skin:  Intact without significant lesions or rashes. Psych:  Alert and cooperative. Normal mood and affect.   Holle Sprick L. Tarri Glenn, MD, MPH 02/25/2022, 3:55 PM

## 2022-02-25 NOTE — Patient Instructions (Addendum)
We have scheduled you for a follow up appointment with Dr.Beavers for 04/08/22 at 3:20 pm.  I recommended two additional stool tests today.  We discussed another trial of Xifaxan.  Continue to follow your plant-heavy diet. Your colon bacteria will be much happier having at least 30 different plant based foods (fruits, vegetables, nuts and legumes) every week.   Thank you for trusting me with your gastrointestinal care!    Tressia Danas, MD, MPH

## 2022-02-27 ENCOUNTER — Other Ambulatory Visit: Payer: BC Managed Care – PPO

## 2022-02-27 DIAGNOSIS — R194 Change in bowel habit: Secondary | ICD-10-CM | POA: Diagnosis not present

## 2022-02-27 DIAGNOSIS — R7989 Other specified abnormal findings of blood chemistry: Secondary | ICD-10-CM | POA: Diagnosis not present

## 2022-02-27 DIAGNOSIS — R197 Diarrhea, unspecified: Secondary | ICD-10-CM | POA: Diagnosis not present

## 2022-02-28 ENCOUNTER — Ambulatory Visit
Admission: RE | Admit: 2022-02-28 | Discharge: 2022-02-28 | Disposition: A | Discharge status: Home / Self Care | Payer: BC Managed Care – PPO | Source: Ambulatory Visit | Attending: Obstetrics & Gynecology | Admitting: Obstetrics & Gynecology

## 2022-02-28 DIAGNOSIS — Z1231 Encounter for screening mammogram for malignant neoplasm of breast: Secondary | ICD-10-CM

## 2022-03-01 ENCOUNTER — Encounter: Payer: Self-pay | Admitting: Gastroenterology

## 2022-03-06 LAB — CALPROTECTIN, FECAL: Calprotectin, Fecal: 17 ug/g (ref 0–120)

## 2022-03-08 LAB — PANCREATIC ELASTASE, FECAL: Pancreatic Elastase-1, Stool: >500 mcg/g

## 2022-03-11 ENCOUNTER — Encounter (HOSPITAL_BASED_OUTPATIENT_CLINIC_OR_DEPARTMENT_OTHER): Payer: Self-pay

## 2022-04-08 ENCOUNTER — Ambulatory Visit: Payer: BC Managed Care – PPO | Admitting: Gastroenterology

## 2022-04-08 ENCOUNTER — Encounter: Payer: Self-pay | Admitting: Gastroenterology

## 2022-04-08 VITALS — BP 118/67 | HR 72 | Ht 61.0 in | Wt 109.0 lb

## 2022-04-08 DIAGNOSIS — R194 Change in bowel habit: Secondary | ICD-10-CM | POA: Diagnosis not present

## 2022-04-08 DIAGNOSIS — R197 Diarrhea, unspecified: Secondary | ICD-10-CM | POA: Diagnosis not present

## 2022-04-08 NOTE — Progress Notes (Signed)
Referring Provider: Ginger Organ., MD Primary Care Physician:  Ginger Organ., MD  Chief Complaint:  Diarrhea   IMPRESSION:  Post-infectious IBS - Presenting with a change in bowel habits with chronic diarrhea. Now improved after 2 weeks of Xifaxan.  Normal screening colonoscopy 11/30/20  PLAN: - Dicyclomine 10 mg QID PRN for additional symptoms - Consider Beano - Colonoscopy with random biopsies if symptoms recur - Office follow-up PRN  HPI: Melinda Andersen is a 52 y.o. female who returns in follow-up. She was last seen 02/24/22 for change in bowel habits and chronic diarrhea thought to be post-infections IBS.   She also has a history of stress incontinence, left inguinal hernia not repaired, and other problems as seen in the problem list below.  I met her at the time of a normal screening colonoscopy in 2022.  Diarrhea was not a reported symptom at that time and random biopsies were not performed.  She is a Microbiologist at [employer]  Diarrhea with associated embarrassing borborygmi started in July.  Several morning poorly formed bowel movements. If she has evening bowel movements things are more formed. Frequent mucous particularly in the morning. No improvement despite align, Florastor, IBGuard, 3 days of Xifaxan for possible traveler's diarrhea, 14 days of Xifaxan in October as the symptoms were temporally associated with a trip to [place] at the [employer] when she ate a veggie burger on a gluten free bun. After the second round of Xifaxan things are better but still not normal.  No identified food triggers. No associated stressors. No blood. She has stoopped using straws and is avoiding carbonated water. No sugar substitutes. Avoiding beans and carniferous vegetables.   Ova and parasite and GI pathogen panel were negative. CBC was normal except for very mild elevation in her monocytes. TTGA, IgA and TSH were normal in September Fecal calprotectin normal at  1712/28/23 Pancreatic elastase greater than 512/28/23  Abdominal x-ray showed a moderate amount of stool in the colon  Symptoms largely resolved while completing 2 weeks of Xifaxan for suspected post-infectious IBS. She has had 3 episodes of diarrhea since then, primarily in the mornings and after eating legumes.    Past Medical History:  Diagnosis Date   Abnormal Pap smear 05/07/2011   [facility]    Erythromelalgia ([place]) 04/2009   chronic red painful toes--[facility]eraldo [facility] (rheumatologist)   Hx of colposcopy with cervical biopsy 2004; 08/12/16   2004 beningn.  2018 path pending (impression by GYN exam was CIN I)   Seasonal allergies     Past Surgical History:  Procedure Laterality Date   CESAREAN SECTION  2003   and 2006   FLEXIBLE SIGMOIDOSCOPY  03/03/1989   Fissure   oral surgery     anesthesia not used   WISDOM TOOTH EXTRACTION  1989   Current Outpatient Medications  Medication Sig Dispense Refill   cholecalciferol (VITAMIN D3) 25 MCG (1000 UT) tablet Take 1,000 Units by mouth daily.     norethindrone (MICRONOR) 0.35 MG tablet Take 1 tablet (0.35 mg total) by mouth daily. 84 tablet 4   fluticasone (FLONASE) 50 MCG/ACT nasal spray      loratadine (CLARITIN) 10 MG tablet      No current facility-administered medications for this visit.    Allergies as of 04/08/2022 - Review Complete 04/08/2022  Allergen Reaction Noted   Penicillins      Family History  Problem Relation Age of Onset   Arthritis Maternal Grandmother  Hyperlipidemia Maternal Grandmother    Hypertension Maternal Grandmother    Depression Maternal Grandmother    Hyperlipidemia Maternal Grandfather    Kidney disease Maternal Grandfather    Heart failure Maternal Grandfather    Alcohol abuse Father    Colon polyps Maternal Aunt    Breast cancer Maternal Aunt    Colon cancer Maternal Aunt    Cancer Other        breast   Esophageal cancer Neg Hx    Stomach cancer Neg Hx    Rectal cancer Neg Hx        Physical Exam: General:   Alert,  well-nourished, pleasant and cooperative in NAD Head:  Normocephalic and atraumatic. Eyes:  Sclera clear, no icterus.   Conjunctiva pink. Abdomen:  Soft, nontender, nondistended, normal bowel sounds, no rebound or guarding. No hepatosplenomegaly.   Neurologic:  Alert and  oriented x4;  grossly nonfocal Skin:  Intact without significant lesions or rashes. Psych:  Alert and cooperative. Normal mood and affect.   Burma Ketcher L. Tarri Glenn, MD, MPH 04/08/2022, 4:01 PM

## 2022-04-08 NOTE — Patient Instructions (Addendum)
It was a pleasure to see you.  We discussed a trial of an antispasmodic and/or BeanO to see if this would help with any ongoing symptoms.  If your symptoms recur, we could repeat the Xifaxan and consider a colonoscopy with colon biopsies.  Please let me know if you need anything else.

## 2022-04-09 MED ORDER — DICYCLOMINE HCL 10 MG PO CAPS: 10.0000 mg | ORAL_CAPSULE | Freq: Four times a day (QID) | ORAL | 3 refills | Status: DC | PRN

## 2022-05-06 ENCOUNTER — Encounter: Payer: Self-pay | Admitting: Gastroenterology

## 2022-05-13 DIAGNOSIS — R001 Bradycardia, unspecified: Secondary | ICD-10-CM | POA: Diagnosis not present

## 2022-05-13 DIAGNOSIS — R7989 Other specified abnormal findings of blood chemistry: Secondary | ICD-10-CM | POA: Diagnosis not present

## 2022-05-19 DIAGNOSIS — Z1331 Encounter for screening for depression: Secondary | ICD-10-CM | POA: Diagnosis not present

## 2022-05-19 DIAGNOSIS — Z1339 Encounter for screening examination for other mental health and behavioral disorders: Secondary | ICD-10-CM | POA: Diagnosis not present

## 2022-05-19 DIAGNOSIS — J309 Allergic rhinitis, unspecified: Secondary | ICD-10-CM | POA: Diagnosis not present

## 2022-05-19 DIAGNOSIS — E785 Hyperlipidemia, unspecified: Secondary | ICD-10-CM | POA: Diagnosis not present

## 2022-05-19 DIAGNOSIS — K580 Irritable bowel syndrome with diarrhea: Secondary | ICD-10-CM | POA: Diagnosis not present

## 2022-05-19 DIAGNOSIS — Z0000 Encounter for general adult medical examination without abnormal findings: Secondary | ICD-10-CM | POA: Diagnosis not present

## 2022-05-20 DIAGNOSIS — R82998 Other abnormal findings in urine: Secondary | ICD-10-CM | POA: Diagnosis not present

## 2022-06-27 ENCOUNTER — Encounter (HOSPITAL_BASED_OUTPATIENT_CLINIC_OR_DEPARTMENT_OTHER): Payer: Self-pay | Admitting: Obstetrics & Gynecology

## 2022-06-27 ENCOUNTER — Ambulatory Visit (INDEPENDENT_AMBULATORY_CARE_PROVIDER_SITE_OTHER): Payer: BC Managed Care – PPO | Admitting: Obstetrics & Gynecology

## 2022-06-27 VITALS — BP 141/85 | HR 68 | Ht 61.0 in | Wt 109.4 lb

## 2022-06-27 DIAGNOSIS — Z3041 Encounter for surveillance of contraceptive pills: Secondary | ICD-10-CM | POA: Diagnosis not present

## 2022-06-27 DIAGNOSIS — Z8742 Personal history of other diseases of the female genital tract: Secondary | ICD-10-CM

## 2022-06-27 DIAGNOSIS — Z01419 Encounter for gynecological examination (general) (routine) without abnormal findings: Secondary | ICD-10-CM

## 2022-06-27 DIAGNOSIS — K4090 Unilateral inguinal hernia, without obstruction or gangrene, not specified as recurrent: Secondary | ICD-10-CM | POA: Diagnosis not present

## 2022-06-27 MED ORDER — NORETHINDRONE 0.35 MG PO TABS
1.0000 | ORAL_TABLET | Freq: Every day | ORAL | 4 refills | Status: DC
  Filled 2023-04-02 – 2023-04-07 (×2): qty 84, 84d supply, fill #0
  Filled 2023-06-27: qty 84, 84d supply, fill #1

## 2022-06-27 NOTE — Progress Notes (Signed)
52 y.o. G20P2002 [name] here for annual exam.  Doing well.  Husband having hip replacement 5/16.  Had trip to [place] and had a lot of lower GI issues.  Has continued to have issues and saw Dr. Orvan Falconer.  Has been diagnosed post-infectious IBS.  Symptoms are definitely better.     Denies vaginal bleeding.   No LMP recorded. (Menstrual status: Oral contraceptives).          Sexually active: Yes.    The current method of family planning is oral progesterone-only contraceptive.    Smoker:  no  Health Maintenance: Pap:  05/24/2020 Negative History of abnormal Pap:  in 2004 and 2018 MMG:  02/28/2022 Negative Colonoscopy:  11/03/2020 BMD: guidelines reviewed Screening Labs: 9/14   reports that she has never smoked. She has never used smokeless tobacco. She reports current alcohol use of about 3.0 - 6.0 standard drinks of alcohol per week. She reports that she does not use drugs.  Past Medical History:  Diagnosis Date   Abnormal Pap smear 05/07/2011   [facility]    Erythromelalgia ([facility]) 04/2009   chronic red painful toes--Dr Christen Bame at [facility] (rheumatologist)   Hx of colposcopy with cervical biopsy 2004; 08/12/16   2004 beningn.  2018 path pending (impression by GYN exam was CIN I)   Seasonal allergies     Past Surgical History:  Procedure Laterality Date   CESAREAN SECTION  2003   and 2006   FLEXIBLE SIGMOIDOSCOPY  03/03/1989   Fissure   oral surgery     anesthesia not used   WISDOM TOOTH EXTRACTION  1989    Current Outpatient Medications  Medication Sig Dispense Refill   cholecalciferol (VITAMIN D3) 25 MCG (1000 UT) tablet Take 1,000 Units by mouth daily.     fluticasone (FLONASE) 50 MCG/ACT nasal spray      loratadine (CLARITIN) 10 MG tablet      norethindrone (MICRONOR) 0.35 MG tablet Take 1 tablet (0.35 mg total) by mouth daily. 84 tablet 4   Probiotic Product (ALIGN PO) Take by mouth.     No current facility-administered medications for this visit.     Family History  Problem Relation Age of Onset   Arthritis Maternal Grandmother    Hyperlipidemia Maternal Grandmother    Hypertension Maternal Grandmother    Depression Maternal Grandmother    Hyperlipidemia Maternal Grandfather    Kidney disease Maternal Grandfather    Heart failure Maternal Grandfather    Alcohol abuse Father    Colon polyps Maternal Aunt    Breast cancer Maternal Aunt    Colon cancer Maternal Aunt    Cancer Other        breast   Esophageal cancer Neg Hx    Stomach cancer Neg Hx    Rectal cancer Neg Hx     ROS: Constitutional: negative Genitourinary:negative  Exam:   BP (!) 141/85 (BP Location: Right Arm, Patient Position: Sitting, Cuff Size: Normal)   Pulse 68   Ht 5\' 1"  (1.549 m) Comment: reported  Wt 109 lb 6.4 oz (49.6 kg)   BMI 20.67 kg/m   Height: 5\' 1"  (154.9 cm) (reported)  General appearance: alert, cooperative and appears stated age Head: Normocephalic, without obvious abnormality, atraumatic Neck: no adenopathy, supple, symmetrical, trachea midline and thyroid normal to inspection and palpation Lungs: clear to auscultation bilaterally Breasts: normal appearance, no masses or tenderness Heart: regular rate and rhythm Abdomen: soft, non-tender; bowel sounds normal; no masses,  no organomegaly Extremities: extremities  normal, atraumatic, no cyanosis or edema Skin: Skin color, texture, turgor normal. No rashes or lesions Lymph nodes: Cervical, supraclavicular, and axillary nodes normal. No abnormal inguinal nodes palpated Neurologic: Grossly normal   Pelvic: External genitalia:  no lesions              Urethra:  normal appearing urethra with no masses, tenderness or lesions              Bartholins and Skenes: normal                 Vagina: normal appearing vagina with normal color and no discharge, no lesions              Cervix: no lesions              Pap taken: No. Bimanual Exam:  Uterus:  normal size, contour, position,  consistency, mobility, non-tender              Adnexa: normal adnexa and no mass, fullness, tenderness               Rectovaginal: deferred due to recent GI evaluation               Anus:  no lesions  Chaperone, Ina Homes, CMA, was present for exam.  Assessment/Plan: 1. Well woman exam with routine gynecological exam - Pap smear 2022, neg HR HPV.  Will repeat next year. - Mammogram up to date - Colonoscopy 2022 - Bone mineral density guidelines reviewed - lab work done with PCP, Dr. Clelia Croft - vaccines reviewed/updated  2. Direct left inguinal hernia - has seen general surgery for consult.  Not bothersome right no2  3. History of abnormal cervical Pap smear  4. Encounter for surveillance of contraceptive pills - norethindrone (MICRONOR) 0.35 MG tablet; Take 1 tablet (0.35 mg total) by mouth daily.  Dispense: 84 tablet; Refill: 4

## 2022-11-06 DIAGNOSIS — L578 Other skin changes due to chronic exposure to nonionizing radiation: Secondary | ICD-10-CM | POA: Diagnosis not present

## 2022-11-06 DIAGNOSIS — D225 Melanocytic nevi of trunk: Secondary | ICD-10-CM | POA: Diagnosis not present

## 2022-11-06 DIAGNOSIS — D485 Neoplasm of uncertain behavior of skin: Secondary | ICD-10-CM | POA: Diagnosis not present

## 2022-11-06 DIAGNOSIS — L821 Other seborrheic keratosis: Secondary | ICD-10-CM | POA: Diagnosis not present

## 2022-11-06 DIAGNOSIS — B078 Other viral warts: Secondary | ICD-10-CM | POA: Diagnosis not present

## 2022-11-06 DIAGNOSIS — D235 Other benign neoplasm of skin of trunk: Secondary | ICD-10-CM | POA: Diagnosis not present

## 2022-12-30 ENCOUNTER — Other Ambulatory Visit (HOSPITAL_BASED_OUTPATIENT_CLINIC_OR_DEPARTMENT_OTHER): Payer: Self-pay

## 2022-12-30 MED ORDER — TRETINOIN 0.025 % EX CREA
1.0000 | TOPICAL_CREAM | Freq: Every day | CUTANEOUS | 2 refills | Status: DC
  Filled 2023-03-05: qty 45, 30d supply, fill #0

## 2022-12-30 MED ORDER — DICYCLOMINE HCL 10 MG PO CAPS: 10.0000 mg | ORAL_CAPSULE | Freq: Four times a day (QID) | ORAL | 3 refills | Status: DC

## 2023-01-02 ENCOUNTER — Other Ambulatory Visit (HOSPITAL_BASED_OUTPATIENT_CLINIC_OR_DEPARTMENT_OTHER): Payer: Self-pay

## 2023-01-02 MED ORDER — DOXYCYCLINE HYCLATE 100 MG PO TABS
100.0000 mg | ORAL_TABLET | Freq: Two times a day (BID) | ORAL | 0 refills | Status: AC
  Filled 2023-01-02: qty 14, 7d supply, fill #0

## 2023-01-15 ENCOUNTER — Other Ambulatory Visit (HOSPITAL_BASED_OUTPATIENT_CLINIC_OR_DEPARTMENT_OTHER): Payer: Self-pay | Admitting: Internal Medicine

## 2023-01-15 DIAGNOSIS — Z1231 Encounter for screening mammogram for malignant neoplasm of breast: Secondary | ICD-10-CM

## 2023-01-22 ENCOUNTER — Encounter (HOSPITAL_BASED_OUTPATIENT_CLINIC_OR_DEPARTMENT_OTHER): Payer: Self-pay

## 2023-03-02 ENCOUNTER — Ambulatory Visit (HOSPITAL_BASED_OUTPATIENT_CLINIC_OR_DEPARTMENT_OTHER): Payer: BC Managed Care – PPO | Admitting: Radiology

## 2023-03-05 ENCOUNTER — Other Ambulatory Visit (HOSPITAL_BASED_OUTPATIENT_CLINIC_OR_DEPARTMENT_OTHER): Payer: Self-pay

## 2023-03-18 ENCOUNTER — Other Ambulatory Visit (HOSPITAL_BASED_OUTPATIENT_CLINIC_OR_DEPARTMENT_OTHER): Payer: Self-pay

## 2023-03-18 MED ORDER — TRETINOIN 0.025 % EX CREA
TOPICAL_CREAM | CUTANEOUS | 11 refills | Status: AC
  Filled 2023-03-18 – 2023-04-07 (×4): qty 45, 30d supply, fill #0
  Filled 2023-06-27: qty 45, 30d supply, fill #1
  Filled 2023-09-25: qty 45, 30d supply, fill #2

## 2023-03-20 ENCOUNTER — Inpatient Hospital Stay (HOSPITAL_BASED_OUTPATIENT_CLINIC_OR_DEPARTMENT_OTHER): Admission: RE | Admit: 2023-03-20 | Payer: BC Managed Care – PPO | Source: Ambulatory Visit | Admitting: Radiology

## 2023-03-20 ENCOUNTER — Encounter (HOSPITAL_BASED_OUTPATIENT_CLINIC_OR_DEPARTMENT_OTHER): Payer: Self-pay

## 2023-03-20 DIAGNOSIS — Z1231 Encounter for screening mammogram for malignant neoplasm of breast: Secondary | ICD-10-CM

## 2023-03-26 ENCOUNTER — Encounter (HOSPITAL_BASED_OUTPATIENT_CLINIC_OR_DEPARTMENT_OTHER): Payer: Self-pay | Admitting: Radiology

## 2023-03-26 ENCOUNTER — Ambulatory Visit (HOSPITAL_BASED_OUTPATIENT_CLINIC_OR_DEPARTMENT_OTHER)
Admission: RE | Admit: 2023-03-26 | Discharge: 2023-03-26 | Disposition: A | Discharge status: Home / Self Care | Payer: BC Managed Care – PPO | Source: Ambulatory Visit

## 2023-03-26 DIAGNOSIS — Z1231 Encounter for screening mammogram for malignant neoplasm of breast: Secondary | ICD-10-CM | POA: Insufficient documentation

## 2023-03-28 ENCOUNTER — Other Ambulatory Visit (HOSPITAL_BASED_OUTPATIENT_CLINIC_OR_DEPARTMENT_OTHER): Payer: Self-pay

## 2023-04-02 ENCOUNTER — Other Ambulatory Visit: Payer: Self-pay

## 2023-04-02 ENCOUNTER — Other Ambulatory Visit (HOSPITAL_BASED_OUTPATIENT_CLINIC_OR_DEPARTMENT_OTHER): Payer: Self-pay

## 2023-04-07 ENCOUNTER — Other Ambulatory Visit (HOSPITAL_BASED_OUTPATIENT_CLINIC_OR_DEPARTMENT_OTHER): Payer: Self-pay

## 2023-04-07 ENCOUNTER — Other Ambulatory Visit: Payer: Self-pay

## 2023-04-08 ENCOUNTER — Other Ambulatory Visit (HOSPITAL_BASED_OUTPATIENT_CLINIC_OR_DEPARTMENT_OTHER): Payer: Self-pay

## 2023-06-29 ENCOUNTER — Other Ambulatory Visit: Payer: Self-pay

## 2023-06-30 ENCOUNTER — Other Ambulatory Visit (HOSPITAL_BASED_OUTPATIENT_CLINIC_OR_DEPARTMENT_OTHER): Payer: Self-pay

## 2023-06-30 ENCOUNTER — Other Ambulatory Visit (HOSPITAL_BASED_OUTPATIENT_CLINIC_OR_DEPARTMENT_OTHER): Payer: Self-pay | Admitting: Obstetrics & Gynecology

## 2023-06-30 DIAGNOSIS — Z3041 Encounter for surveillance of contraceptive pills: Secondary | ICD-10-CM

## 2023-07-01 ENCOUNTER — Other Ambulatory Visit: Payer: Self-pay

## 2023-07-01 ENCOUNTER — Other Ambulatory Visit (HOSPITAL_BASED_OUTPATIENT_CLINIC_OR_DEPARTMENT_OTHER): Payer: Self-pay

## 2023-07-01 MED ORDER — NORETHINDRONE 0.35 MG PO TABS
1.0000 | ORAL_TABLET | Freq: Every day | ORAL | 0 refills | Status: DC
  Filled 2023-07-01: qty 84, 84d supply, fill #0

## 2023-08-18 ENCOUNTER — Ambulatory Visit (HOSPITAL_BASED_OUTPATIENT_CLINIC_OR_DEPARTMENT_OTHER): Admitting: Obstetrics & Gynecology

## 2023-08-18 ENCOUNTER — Encounter (HOSPITAL_BASED_OUTPATIENT_CLINIC_OR_DEPARTMENT_OTHER): Payer: Self-pay | Admitting: Obstetrics & Gynecology

## 2023-08-18 ENCOUNTER — Other Ambulatory Visit (HOSPITAL_BASED_OUTPATIENT_CLINIC_OR_DEPARTMENT_OTHER): Payer: Self-pay

## 2023-08-18 ENCOUNTER — Other Ambulatory Visit (HOSPITAL_COMMUNITY)
Admission: RE | Admit: 2023-08-18 | Discharge: 2023-08-18 | Disposition: A | Discharge status: Home / Self Care | Source: Ambulatory Visit | Attending: Obstetrics & Gynecology | Admitting: Obstetrics & Gynecology

## 2023-08-18 VITALS — BP 117/80 | HR 70 | Ht 61.5 in | Wt 109.4 lb

## 2023-08-18 DIAGNOSIS — Z3041 Encounter for surveillance of contraceptive pills: Secondary | ICD-10-CM

## 2023-08-18 DIAGNOSIS — K4090 Unilateral inguinal hernia, without obstruction or gangrene, not specified as recurrent: Secondary | ICD-10-CM | POA: Diagnosis not present

## 2023-08-18 DIAGNOSIS — Z124 Encounter for screening for malignant neoplasm of cervix: Secondary | ICD-10-CM | POA: Diagnosis present

## 2023-08-18 DIAGNOSIS — Z01419 Encounter for gynecological examination (general) (routine) without abnormal findings: Secondary | ICD-10-CM

## 2023-08-18 MED ORDER — NORETHINDRONE 0.35 MG PO TABS
1.0000 | ORAL_TABLET | Freq: Every day | ORAL | 3 refills | Status: AC
  Filled 2023-08-18 – 2023-09-25 (×2): qty 84, 84d supply, fill #0
  Filled 2023-12-26: qty 84, 84d supply, fill #1
  Filled 2024-03-21: qty 84, 84d supply, fill #2

## 2023-08-18 NOTE — Progress Notes (Signed)
 ANNUAL EXAM Patient name: Melinda Andersen MRN [id number]  Date of birth: May 25, 1970 Chief Complaint:   Annual Exam  History of Present Illness:   Melinda Andersen is a 53 y.o. G57P2002 Caucasian female being seen today for a routine annual exam. Doing well.  Denies vaginal bleeding.    No LMP recorded. (Menstrual status: Oral contraceptives).   Last pap 05/24/2020. Results were: NILM w/ HRHPV negative. H/O abnormal pap: no Last mammogram: 03/26/2023. Results were: normal. Family h/o breast cancer: yes maternal great aunt Last colonoscopy: 11/30/2020. Results were: normal. Family h/o colorectal cancer: maternal aunt     08/18/2023    8:32 AM 06/27/2022    9:27 AM 05/31/2021   11:54 AM 05/24/2020    3:34 PM  Depression screen PHQ 2/9  Decreased Interest 0 0 0 0  Down, Depressed, Hopeless 0 0 0 0  PHQ - 2 Score 0 0 0 0     Review of Systems:   Pertinent items are noted in HPI  Denies any urinary changes, bowel changes, pelvic pain. Pertinent History Reviewed:  Reviewed past medical,surgical, social and family history.  Reviewed problem list, medications and allergies. Physical Assessment:   Vitals:   08/18/23 0828 08/18/23 0902  BP: (!) 153/83 117/80  Pulse: 70   Weight: 109 lb 6.4 oz (49.6 kg)   Height: 5' 1.5 (1.562 m)   Body mass index is 20.34 kg/m.        Physical Examination:   General appearance - well appearing, and in no distress  Mental status - alert, oriented to person, place, and time  Psych:  She has a normal mood and affect  Skin - warm and dry, normal color, no suspicious lesions noted  Chest - effort normal, all lung fields clear to auscultation bilaterally  Heart - normal rate and regular rhythm  Neck:  midline trachea, no thyromegaly or nodules  Breasts - breasts appear normal, no suspicious masses, no skin or nipple changes or  axillary nodes  Abdomen - soft, nontender, nondistended, no masses or organomegaly, small umbilical hernia and left lower  quadrant hernia present  Pelvic - VULVA: normal appearing vulva with no masses, tenderness or lesions   VAGINA: normal appearing vagina with normal color and discharge, no lesions   CERVIX: normal appearing cervix without discharge or lesions, no CMT  Thin prep pap is done with HR HPV cotesting  UTERUS: uterus is felt to be normal size, shape, consistency and nontender   ADNEXA: No adnexal masses or tenderness noted.  Rectal - normal rectal, good sphincter tone, no masses felt.   Extremities:  No swelling or varicosities noted  Chaperone present for exam  Assessment & Plan:  1. Well woman exam with routine gynecological exam (Primary) - Pap smear updated today - Mammogram 03/2023 - Colonoscopy 2021.  Follow up 10 years.  - Bone mineral density not indicated - lab work done with PCP, Dr. Bernetta Brilliant - vaccines reviewed/updated  2. Cervical cancer screening - Cytology - PAP( [place])  3. Encounter for surveillance of contraceptive pills - norethindrone  (INCASSIA ) 0.35 MG tablet; Take 1 tablet (0.35 mg total) by mouth daily.  Dispense: 84 tablet; Refill: 3  4. Direct left inguinal hernia - stable.  Has seen general surgeon.  Not interested in surgical treatment at this time.   Meds:  Meds ordered this encounter  Medications   norethindrone  (INCASSIA ) 0.35 MG tablet    Sig: Take 1 tablet (0.35 mg total) by mouth daily.  Dispense:  84 tablet    Refill:  3    Follow-up: Return in about 1 year (around 08/17/2024).  Lillian Rein, MD 08/18/2023 9:09 AM

## 2023-08-20 ENCOUNTER — Ambulatory Visit (HOSPITAL_BASED_OUTPATIENT_CLINIC_OR_DEPARTMENT_OTHER): Payer: Self-pay | Admitting: Obstetrics & Gynecology

## 2023-08-20 LAB — CYTOLOGY - PAP
Comment: NEGATIVE
Diagnosis: NEGATIVE
Diagnosis: REACTIVE
High risk HPV: NEGATIVE

## 2023-09-26 ENCOUNTER — Other Ambulatory Visit (HOSPITAL_BASED_OUTPATIENT_CLINIC_OR_DEPARTMENT_OTHER): Payer: Self-pay

## 2023-09-28 ENCOUNTER — Other Ambulatory Visit: Payer: Self-pay

## 2023-12-26 ENCOUNTER — Other Ambulatory Visit (HOSPITAL_BASED_OUTPATIENT_CLINIC_OR_DEPARTMENT_OTHER): Payer: Self-pay

## 2023-12-28 ENCOUNTER — Other Ambulatory Visit: Payer: Self-pay

## 2023-12-29 ENCOUNTER — Other Ambulatory Visit (HOSPITAL_BASED_OUTPATIENT_CLINIC_OR_DEPARTMENT_OTHER): Payer: Self-pay

## 2023-12-29 MED ORDER — TRETINOIN 0.025 % EX CREA
TOPICAL_CREAM | CUTANEOUS | 0 refills | Status: AC
  Filled 2023-12-29: qty 45, 30d supply, fill #0

## 2024-01-21 ENCOUNTER — Other Ambulatory Visit (HOSPITAL_BASED_OUTPATIENT_CLINIC_OR_DEPARTMENT_OTHER): Payer: Self-pay

## 2024-01-21 MED ORDER — MUPIROCIN 2 % EX OINT
1.0000 | TOPICAL_OINTMENT | Freq: Two times a day (BID) | CUTANEOUS | 0 refills | Status: AC
  Filled 2024-01-21: qty 22, 11d supply, fill #0

## 2024-01-21 MED ORDER — DOXYCYCLINE HYCLATE 100 MG PO TABS
100.0000 mg | ORAL_TABLET | Freq: Two times a day (BID) | ORAL | 0 refills | Status: AC
  Filled 2024-01-21: qty 10, 5d supply, fill #0

## 2024-08-23 ENCOUNTER — Ambulatory Visit (HOSPITAL_BASED_OUTPATIENT_CLINIC_OR_DEPARTMENT_OTHER): Admitting: Obstetrics & Gynecology
